# Patient Record
Sex: Female | Born: 1980 | Race: Black or African American | Hispanic: No | Marital: Single | Smoking: Never smoker
Health system: Southern US, Community
[De-identification: ages and names within clinical notes are randomized; demographics above are authoritative.]

## PROBLEM LIST (undated history)

## (undated) DIAGNOSIS — O24419 Gestational diabetes mellitus in pregnancy, unspecified control: Secondary | ICD-10-CM

## (undated) DIAGNOSIS — R519 Headache, unspecified: Secondary | ICD-10-CM

## (undated) DIAGNOSIS — I10 Essential (primary) hypertension: Secondary | ICD-10-CM

## (undated) HISTORY — DX: Gestational diabetes mellitus in pregnancy, unspecified control: O24.419

## (undated) HISTORY — DX: Essential (primary) hypertension: I10

## (undated) HISTORY — DX: Headache, unspecified: R51.9

## (undated) HISTORY — PX: LIPOSUCTION: SHX10

---

## 2013-02-24 ENCOUNTER — Emergency Department (HOSPITAL_BASED_OUTPATIENT_CLINIC_OR_DEPARTMENT_OTHER)
Admission: EM | Admit: 2013-02-24 | Discharge: 2013-02-24 | Disposition: A | Discharge status: Home / Self Care | Payer: Self-pay | Attending: Emergency Medicine | Admitting: Emergency Medicine

## 2013-02-24 ENCOUNTER — Emergency Department (HOSPITAL_BASED_OUTPATIENT_CLINIC_OR_DEPARTMENT_OTHER): Payer: Self-pay

## 2013-02-24 ENCOUNTER — Encounter (HOSPITAL_BASED_OUTPATIENT_CLINIC_OR_DEPARTMENT_OTHER): Payer: Self-pay | Admitting: Family Medicine

## 2013-02-24 DIAGNOSIS — H9201 Otalgia, right ear: Secondary | ICD-10-CM

## 2013-02-24 DIAGNOSIS — Z3202 Encounter for pregnancy test, result negative: Secondary | ICD-10-CM | POA: Insufficient documentation

## 2013-02-24 DIAGNOSIS — H9209 Otalgia, unspecified ear: Secondary | ICD-10-CM | POA: Insufficient documentation

## 2013-02-24 LAB — PREGNANCY, URINE: Preg Test, Ur: NEGATIVE

## 2013-02-24 MED ORDER — CEPHALEXIN 500 MG PO CAPS: 500.0000 mg | ORAL_CAPSULE | Freq: Four times a day (QID) | ORAL | Status: DC

## 2013-02-24 MED ORDER — OXYCODONE-ACETAMINOPHEN 5-325 MG PO TABS: 2.0000 | ORAL_TABLET | ORAL | Status: DC | PRN

## 2013-02-24 NOTE — ED Provider Notes (Signed)
History     CSN: [id number]  Arrival date & time 02/24/13  1047   First MD Initiated Contact with Patient 02/24/13 1132      Chief Complaint  Patient presents with  . Headache    (Consider location/radiation/quality/duration/timing/severity/associated sxs/prior treatment) HPI Comments: Patient complains of right-sided headache near her right ear for the past one day. 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 Pain is spreading diffusely from that site in her head. Denies any nausea, vomiting or fever. Denies any focal weakness, numbness or tingling. No vision changes. No bleeding or drainage from sites or from ear itself.  The history is provided by the patient.    History reviewed. No pertinent past medical history.  History reviewed. No pertinent past surgical history.  No family history on file.  History  Substance Use Topics  . Smoking status: Never Smoker   . Smokeless tobacco: Not on file  . Alcohol Use: Yes    OB History   Grav Para Term Preterm Abortions TAB SAB Ect Mult Living                  Review of Systems  Constitutional: Negative for fever, activity change and appetite change.  HENT: Positive for ear pain. Negative for congestion, rhinorrhea and ear discharge.   Respiratory: Negative for cough, chest tightness and shortness of breath.   Cardiovascular: Negative for chest pain.  Gastrointestinal: Negative for nausea, vomiting and abdominal pain.  Genitourinary: Negative for dysuria and hematuria.  Musculoskeletal: Negative for back pain.  Neurological: Positive for headaches.  A complete 10 system review of systems was obtained and all systems are negative except as noted in the HPI and PMH.    Allergies  Review of patient's allergies indicates no known allergies.  Home Medications   Current Outpatient Rx  Name  Route  Sig  Dispense  Refill  . cephALEXin (KEFLEX) 500 MG capsule   Oral   Take 1  capsule (500 mg total) by mouth 4 (four) times daily.   40 capsule   0   . oxyCODONE-acetaminophen (PERCOCET/ROXICET) 5-325 MG per tablet   Oral   Take 2 tablets by mouth every 4 (four) hours as needed for pain.   15 tablet   0     BP 121/78  Pulse 90  Temp(Src) 98.3 F (36.8 C) (Oral)  Resp 16  Ht 5\' 7"  (1.702 m)  Wt 180 lb (81.647 kg)  BMI 28.19 kg/m2  SpO2 100%  LMP 01/16/2013  Physical Exam  Constitutional: She is oriented to person, place, and time. She appears well-developed and well-nourished. No distress.  HENT:  Head: Normocephalic and atraumatic.  Left Ear: External ear normal.  Ears:  Mouth/Throat: Oropharynx is clear and moist. No oropharyngeal exudate.  Small punctate lesion. No surrounding erythema or fluctuance. No tragus pain. No mastoid pain.  Eyes: EOM are normal. Pupils are equal, round, and reactive to light.  Neck: Normal range of motion. Neck supple.  Cardiovascular: Normal rate, regular rhythm and normal heart sounds.   No murmur heard. Pulmonary/Chest: Effort normal and breath sounds normal. No respiratory distress.  Abdominal: Soft. There is no tenderness. There is no rebound and no guarding.  Musculoskeletal: Normal range of motion. She exhibits no edema and no tenderness.  Neurological: She is alert and oriented to person, place, and time. No cranial nerve deficit. She exhibits normal muscle tone. Coordination normal.  CN 2-12 intact, 5/5 strength throughout, no ataxia on finger to nose.    ED Course  Procedures (including critical care time)  Labs Reviewed  PREGNANCY, URINE   Ct Head Wo Contrast  02/24/2013   *RADIOLOGY REPORT*  Clinical Data: Headache  CT HEAD WITHOUT CONTRAST  Technique:  Contiguous axial images were obtained from the base of the skull through the vertex without contrast.  Comparison: None.  Findings: No skull fracture is noted.  Paranasal sinuses and mastoid air cells are unremarkable.  No intracranial hemorrhage, mass  effect or midline shift.  The gray and white matter differentiation is preserved.  No acute infarction.  No hydrocephalus.  No mass lesion is noted on this unenhanced scan. No intra or extra-axial fluid collection.  IMPRESSION: No acute intracranial abnormality.   Original Report Authenticated By: Natasha Mead, M.D.     1. Ear pain, right       MDM  Right ear pain for the past day. No evidence of otitis externa. No fever. Pain spreads up causing a diffuse headache.  There is no erythema, drainage or fluctuance to the site she is concerned with on her ear.  We'll treat pain, warm compresses, empiric Keflex, followup with ENT.       Glynn Octave, MD 02/24/13 1313

## 2013-02-24 NOTE — ED Notes (Signed)
Pt c/o head "aching" near right ear x 1 day. 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 Pt did not take any meds at home prior to coming to dept.

## 2014-04-16 ENCOUNTER — Encounter (HOSPITAL_BASED_OUTPATIENT_CLINIC_OR_DEPARTMENT_OTHER): Payer: Self-pay | Admitting: Emergency Medicine

## 2014-04-16 ENCOUNTER — Emergency Department (HOSPITAL_BASED_OUTPATIENT_CLINIC_OR_DEPARTMENT_OTHER): Payer: Self-pay

## 2014-04-16 ENCOUNTER — Emergency Department (HOSPITAL_BASED_OUTPATIENT_CLINIC_OR_DEPARTMENT_OTHER)
Admission: EM | Admit: 2014-04-16 | Discharge: 2014-04-17 | Disposition: A | Discharge status: Home / Self Care | Payer: Self-pay | Attending: Emergency Medicine | Admitting: Emergency Medicine

## 2014-04-16 DIAGNOSIS — Z792 Long term (current) use of antibiotics: Secondary | ICD-10-CM | POA: Insufficient documentation

## 2014-04-16 DIAGNOSIS — J209 Acute bronchitis, unspecified: Secondary | ICD-10-CM | POA: Insufficient documentation

## 2014-04-16 NOTE — ED Provider Notes (Signed)
CSN: [id number]     Arrival date & time 04/16/14  2324 History  This chart was scribed for Kristin SeamenJohn L Shannelle Alguire, MD by Julian HyMorgan Graham, ED Scribe. The patient was seen in [facility]. The patient's care was started at 11:46 PM.    Chief Complaint  Patient presents with  . Cough   The history is provided by the patient. No language interpreter was used.   HPI Comments: Kristin Lucas is a 33 y.o. female who presents to the Emergency Department complaining of persistent, occasionally productive cough for over a week. Pt states she has associated headache and chest pain with cough, intermittent SOB, diarrhea and wheezing. She has been taking OTC medications with minimal relief. Pt denies earache, vomiting, sore throat and abdominal pain.   History reviewed. No pertinent past medical history. Past Surgical History  Procedure Laterality Date  . Cesarean section     No family history on file. History  Substance Use Topics  . Smoking status: Never Smoker   . Smokeless tobacco: Not on file  . Alcohol Use: Yes     Comment: occ   OB History   Grav Para Term Preterm Abortions TAB SAB Ect Mult Living                 Review of Systems  Respiratory: Positive for cough, shortness of breath and wheezing.   All other systems reviewed and are negative.  A complete 10 system review of systems was obtained and all systems are negative except as noted in the HPI and PMH.    Allergies  Review of patient's allergies indicates no known allergies.  Home Medications   Prior to Admission medications   Medication Sig Start Date End Date Taking? Authorizing Provider  cephALEXin (KEFLEX) 500 MG capsule Take 1 capsule (500 mg total) by mouth 4 (four) times daily. 02/24/13   Glynn OctaveStephen Rancour, MD  oxyCODONE-acetaminophen (PERCOCET/ROXICET) 5-325 MG per tablet Take 2 tablets by mouth every 4 (four) hours as needed for pain. 02/24/13   Glynn OctaveStephen Rancour, MD   Triage Vitals: BP 152/95  Pulse 76  Temp(Src) 97.9 F (36.6  C) (Oral)  Resp 18  Ht 5\' 7"  (1.702 m)  Wt 196 lb (88.905 kg)  BMI 30.69 kg/m2  SpO2 98%  LMP 03/17/2014 Physical Exam  Nursing note and vitals reviewed. General: Well-developed, well-nourished female in no acute distress; appearance consistent with age of record HENT: normocephalic; atraumatic; patechiae of palate Eyes: pupils equal, round and reactive to light; extraocular muscles intact Neck: supple Heart: regular rate and rhythm; no murmurs, rubs or gallops Lungs: clear to auscultation bilaterally Abdomen: soft; nondistended; nontender; no masses or hepatosplenomegaly; bowel sounds present Extremities: No deformity; full range of motion; pulses normal Neurologic: Awake, alert and oriented; motor function intact in all extremities and symmetric; no facial droop Skin: Warm and dry Psychiatric: Normal mood and affect    ED Course  Procedures (including critical care time) DIAGNOSTIC STUDIES: Oxygen Saturation is 98% on RA, normal by my interpretation.    COORDINATION OF CARE: 11:50 PM- Patient informed of current plan for treatment and evaluation and agrees with plan at this time.   MDM  Nursing notes and vitals signs, including pulse oximetry, reviewed.  Summary of this visit's results, reviewed by myself:  Imaging Studies: Dg Chest 2 View  04/16/2014   CLINICAL DATA:  Cough for 6 days.  EXAM: CHEST  2 VIEW  COMPARISON:  None.  FINDINGS: Mild thoracic curvature convex towards the right may represent  patient positioning or mild scoliosis. Normal heart size and pulmonary vascularity. Lungs appear clear and expanded. No focal airspace disease or consolidation. No blunting of costophrenic angles. No pneumothorax.  IMPRESSION: No active cardiopulmonary disease.   Electronically Signed   By: Burman Nieves M.D.   On: 04/16/2014 23:58    I personally performed the services described in this documentation, which was scribed in my presence. The recorded information has been  reviewed and is accurate.   Kristin Seamen, MD 04/17/14 [phone]

## 2014-04-16 NOTE — ED Notes (Signed)
Productive Cough and chest congestion x1 week.

## 2014-04-16 NOTE — ED Notes (Signed)
Patient transported to X-ray 

## 2014-04-17 MED ORDER — ALBUTEROL SULFATE HFA 108 (90 BASE) MCG/ACT IN AERS
2.0000 | INHALATION_SPRAY | RESPIRATORY_TRACT | Status: DC | PRN
  Administered 2014-04-17: 2 via RESPIRATORY_TRACT
  Filled 2014-04-17: qty 6.7

## 2014-04-17 MED ORDER — HYDROCOD POLST-CHLORPHEN POLST 10-8 MG/5ML PO LQCR: 5.0000 mL | Freq: Two times a day (BID) | ORAL | Status: DC | PRN

## 2014-04-17 NOTE — Discharge Instructions (Signed)
Acute Bronchitis °Bronchitis is inflammation of the airways that extend from the windpipe into the lungs (bronchi). The inflammation often causes mucus to develop. This leads to a cough, which is the most common symptom of bronchitis.  °In acute bronchitis, the condition usually develops suddenly and goes away over time, usually in a couple weeks. Smoking, allergies, and asthma can make bronchitis worse. Repeated episodes of bronchitis may cause further lung problems.  °CAUSES °Acute bronchitis is most often caused by the same virus that causes a cold. The virus can spread from person to person (contagious).  °SIGNS AND SYMPTOMS  °· Cough.   °· Fever.   °· Coughing up mucus.   °· Body aches.   °· Chest congestion.   °· Chills.   °· Shortness of breath.   °· Sore throat.   °DIAGNOSIS  °Acute bronchitis is usually diagnosed through a physical exam. Tests, such as chest X-rays, are sometimes done to rule out other conditions.  °TREATMENT  °Acute bronchitis usually goes away in a couple weeks. Often times, no medical treatment is necessary. Medicines are sometimes given for relief of fever or cough. Antibiotics are usually not needed but may be prescribed in certain situations. In some cases, an inhaler may be recommended to help reduce shortness of breath and control the cough. A cool mist vaporizer may also be used to help thin bronchial secretions and make it easier to clear the chest.  °HOME CARE INSTRUCTIONS °· Get plenty of rest.   °· Drink enough fluids to keep your urine clear or pale yellow (unless you have a medical condition that requires fluid restriction). Increasing fluids may help thin your secretions and will prevent dehydration.   °· Only take over-the-counter or prescription medicines as directed by your health care provider.   °· Avoid smoking and secondhand smoke. Exposure to cigarette smoke or irritating chemicals will make bronchitis worse. If you are a smoker, consider using nicotine gum or skin  patches to help control withdrawal symptoms. Quitting smoking will help your lungs heal faster.   °· Reduce the chances of another bout of acute bronchitis by washing your hands frequently, avoiding people with cold symptoms, and trying not to touch your hands to your mouth, nose, or eyes.   °· Follow up with your health care provider as directed.   °SEEK MEDICAL CARE IF: °Your symptoms do not improve after 1 week of treatment.  °SEEK IMMEDIATE MEDICAL CARE IF: °· You develop an increased fever or chills.   °· You have chest pain.   °· You have severe shortness of breath. °· You have bloody sputum.   °· You develop dehydration. °· You develop fainting. °· You develop repeated vomiting. °· You develop a severe headache. °MAKE SURE YOU:  °· Understand these instructions. °· Will watch your condition. °· Will get help right away if you are not doing well or get worse. °Document Released: 11/01/2004 Document Revised: 05/27/2013 Document Reviewed: 03/17/2013 °[employer]® Patient Information ©2015 [employer]. This information is not intended to replace advice given to you by your health care provider. Make sure you discuss any questions you have with your health care provider. ° °

## 2015-01-25 ENCOUNTER — Encounter (HOSPITAL_COMMUNITY): Payer: Self-pay | Admitting: *Deleted

## 2015-01-25 ENCOUNTER — Emergency Department (INDEPENDENT_AMBULATORY_CARE_PROVIDER_SITE_OTHER)
Admission: EM | Admit: 2015-01-25 | Discharge: 2015-01-25 | Disposition: A | Discharge status: Home / Self Care | Payer: PRIVATE HEALTH INSURANCE | Source: Home / Self Care | Attending: Emergency Medicine | Admitting: Emergency Medicine

## 2015-01-25 DIAGNOSIS — J40 Bronchitis, not specified as acute or chronic: Secondary | ICD-10-CM | POA: Diagnosis not present

## 2015-01-25 DIAGNOSIS — K5909 Other constipation: Secondary | ICD-10-CM

## 2015-01-25 DIAGNOSIS — K5904 Chronic idiopathic constipation: Secondary | ICD-10-CM

## 2015-01-25 MED ORDER — AZITHROMYCIN 250 MG PO TABS
ORAL_TABLET | ORAL | Status: DC
Start: 2015-01-25 — End: 2015-07-19

## 2015-01-25 MED ORDER — PREDNISONE 50 MG PO TABS: ORAL_TABLET | ORAL | Status: DC

## 2015-01-25 MED ORDER — ALBUTEROL SULFATE HFA 108 (90 BASE) MCG/ACT IN AERS: 2.0000 | INHALATION_SPRAY | Freq: Four times a day (QID) | RESPIRATORY_TRACT | Status: AC | PRN

## 2015-01-25 MED ORDER — HYDROCODONE-HOMATROPINE 5-1.5 MG/5ML PO SYRP: 5.0000 mL | ORAL_SOLUTION | Freq: Four times a day (QID) | ORAL | Status: DC | PRN

## 2015-01-25 MED ORDER — LACTULOSE 10 GM/15ML PO SOLN: 10.0000 g | Freq: Two times a day (BID) | ORAL | Status: DC

## 2015-01-25 NOTE — Discharge Instructions (Signed)
You have bronchitis. Take prednisone and azithromycin as prescribed. Use the albuterol inhaler as needed for wheezing. You can makes honey with warm water for your cough. Use the Hycodan cough syrup as needed.  Take lactulose twice a day until you are having regular bowel movements. Then, you can take it daily or as needed.  Follow-up as needed.

## 2015-01-25 NOTE — ED Provider Notes (Signed)
CSN: [id number]     Arrival date & time 01/25/15  1439 History   First MD Initiated Contact with Patient 01/25/15 1611     Chief Complaint  Patient presents with  . Cough   (Consider location/radiation/quality/duration/timing/severity/associated sxs/prior Treatment) HPI  She is a 34 year old woman here for evaluation of cough. She states she has had a cough with headaches and subjective fever for the last 2 weeks. This is associated with a sore throat. In the last few days, the cough has been getting worse. She also reports some pain in her chest with coughing. She has had some wheezing as well as feeling short of breath. No fevers in the last few days. No nausea or vomiting. Her appetite is good. She's been taking DayQuil and NyQuil with minimal improvement.  She also reports a long history of chronic constipation. She states she has a bowel movement about 3 times a month. She has tried MiraLAX without improvement.  History reviewed. No pertinent past medical history. Past Surgical History  Procedure Laterality Date  . Cesarean section  2007   History reviewed. No pertinent family history. History  Substance Use Topics  . Smoking status: Never Smoker   . Smokeless tobacco: Not on file  . Alcohol Use: Yes     Comment: occ   OB History    No data available     Review of Systems  Constitutional: Positive for fever. Negative for activity change and appetite change.  HENT: Positive for sore throat. Negative for congestion, rhinorrhea and trouble swallowing.   Respiratory: Positive for cough, shortness of breath and wheezing.   Cardiovascular: Positive for chest pain (with cough).  Gastrointestinal: Negative for nausea and vomiting.  Neurological: Positive for headaches.    Allergies  Review of patient's allergies indicates no known allergies.  Home Medications   Prior to Admission medications   Medication Sig Start Date End Date Taking? Authorizing Provider  albuterol  (PROVENTIL HFA;VENTOLIN HFA) 108 (90 BASE) MCG/ACT inhaler Inhale 2 puffs into the lungs every 6 (six) hours as needed for wheezing or shortness of breath. 01/25/15   Charm RingsErin J Rhyder Koegel, MD  azithromycin (ZITHROMAX Z-PAK) 250 MG tablet Take 2 pills today, then 1 pill daily until gone. 01/25/15   Charm RingsErin J Darcee Dekker, MD  HYDROcodone-homatropine (HYCODAN) 5-1.5 MG/5ML syrup Take 5 mLs by mouth every 6 (six) hours as needed for cough. 01/25/15   Charm RingsErin J Avarie Tavano, MD  lactulose (CHRONULAC) 10 GM/15ML solution Take 15 mLs (10 g total) by mouth 2 (two) times daily. 01/25/15   Charm RingsErin J Lorissa Kishbaugh, MD  predniSONE (DELTASONE) 50 MG tablet Take 1 pill daily for 5 days. 01/25/15   Charm RingsErin J Kayley Zeiders, MD   BP 140/86 mmHg  Pulse 95  Temp(Src) 98.2 F (36.8 C) (Oral)  Resp 12  SpO2 100%  LMP 01/20/2015 Physical Exam  Constitutional: She is oriented to person, place, and time. She appears well-developed and well-nourished.  HENT:  Nose: Nose normal.  Mouth/Throat: No oropharyngeal exudate.  Neck: Neck supple.  Cardiovascular: Normal rate, regular rhythm and normal heart sounds.   No murmur heard. Pulmonary/Chest: Effort normal and breath sounds normal. No respiratory distress. She has no wheezes. She has no rales. She exhibits tenderness.  Lymphadenopathy:    She has no cervical adenopathy.  Neurological: She is alert and oriented to person, place, and time.    ED Course  Procedures (including critical care time) Labs Review Labs Reviewed - No data to display  Imaging Review No results  found.   MDM   1. Bronchitis   2. Constipation - functional    We'll treat bronchitis with prednisone and azithromycin. Albuterol and Hycodan to use as needed. Lactulose given for chronic constipation. Follow-up as needed.    Charm Rings, MD 01/25/15 [phone]

## 2015-01-25 NOTE — ED Notes (Addendum)
C/o cough, hard to breathe, sore throat and headache off and on for 2 weeks. Has had the cold for almost 2 weeks.  No runny nose or earache.  Has had some wheezing.  C/o ant/post. chest pain from coughing.

## 2015-07-19 ENCOUNTER — Encounter: Payer: Self-pay | Admitting: Certified Nurse Midwife

## 2015-07-19 ENCOUNTER — Ambulatory Visit (INDEPENDENT_AMBULATORY_CARE_PROVIDER_SITE_OTHER): Payer: PRIVATE HEALTH INSURANCE | Admitting: Certified Nurse Midwife

## 2015-07-19 VITALS — BP 120/74 | HR 68 | Resp 16 | Ht 66.5 in | Wt 189.0 lb

## 2015-07-19 DIAGNOSIS — Z0000 Encounter for general adult medical examination without abnormal findings: Secondary | ICD-10-CM | POA: Diagnosis not present

## 2015-07-19 DIAGNOSIS — Z124 Encounter for screening for malignant neoplasm of cervix: Secondary | ICD-10-CM

## 2015-07-19 DIAGNOSIS — Z01419 Encounter for gynecological examination (general) (routine) without abnormal findings: Secondary | ICD-10-CM

## 2015-07-19 NOTE — Addendum Note (Signed)
Addended by: Eliezer Bottom on: 07/19/2015 11:48 AM   Modules accepted: Orders, SmartSet

## 2015-07-19 NOTE — Patient Instructions (Signed)

## 2015-07-19 NOTE — Progress Notes (Signed)
Encounter reviewed Jill Jertson, MD   

## 2015-07-19 NOTE — Progress Notes (Signed)
34 y.o. G1P1001 Single  African American Fe here to establish gyn care and  for annual exam. Periods monthly,normal, heavy at times with 5-6 duration. Uses Motrin for cramping, works well. Sexually active and has noted, which is light but requires a panti-liner for 3 days. Notices this happens only with latex condoms and not with lamb skin use. Desires STD screening. Sees Urgent care if needed. No other health issues today.  Patient's last menstrual period was 06/23/2015.          Sexually active: Yes.    The current method of family planning is condoms all the time.    Exercising: No.  exercise Smoker:  no  Health Maintenance: Pap:  2013 neg per patient MMG:  2013 neg per patient Colonoscopy:  none BMD:   none TDaP: unsure, pt declines today Labs: none Self breast exam: not done   reports that she has never smoked. She does not have any smokeless tobacco history on file. She reports that she drinks alcohol. She reports that she does not use illicit drugs.  History reviewed. No pertinent past medical history.  Past Surgical History  Procedure Laterality Date  . Cesarean section  2007    Current Outpatient Prescriptions  Medication Sig Dispense Refill  . albuterol (PROVENTIL HFA;VENTOLIN HFA) 108 (90 BASE) MCG/ACT inhaler Inhale 2 puffs into the lungs every 6 (six) hours as needed for wheezing or shortness of breath. 1 Inhaler 0   No current facility-administered medications for this visit.    Family History  Problem Relation Age of Onset  . Diabetes Father   . Hypertension Father     ROS:  Pertinent items are noted in HPI.  Otherwise, a comprehensive ROS was negative.  Exam:   BP 120/74 mmHg  Pulse 68  Resp 16  Ht 5' 6.5" (1.689 m)  Wt 189 lb (85.73 kg)  BMI 30.05 kg/m2  LMP 06/23/2015 Height: 5' 6.5" (168.9 cm) Ht Readings from Last 3 Encounters:  07/19/15 5' 6.5" (1.689 m)  04/16/14  (1.702 m)  02/24/13  (1.702 m)    General appearance: alert,  cooperative and appears stated age Head: Normocephalic, without obvious abnormality, atraumatic Neck: no adenopathy, supple, symmetrical, trachea midline and thyroid normal to inspection and palpation Lungs: clear to auscultation bilaterally Breasts: normal appearance, no masses or tenderness, No nipple retraction or dimpling, No nipple discharge or bleeding, No axillary or supraclavicular adenopathy Heart: regular rate and rhythm Abdomen: soft, non-tender; no masses,  no organomegaly Extremities: extremities normal, atraumatic, no cyanosis or edema Skin: Skin color, texture, turgor normal. No rashes or lesions Lymph nodes: Cervical, supraclavicular, and axillary nodes normal. No abnormal inguinal nodes palpated Neurologic: Grossly normal   Pelvic: External genitalia:  no lesions              Urethra:  normal appearing urethra with no masses, tenderness or lesions              Bartholin's and Skene's: normal                 Vagina: normal appearing vagina with normal color and discharge, no lesions, affirm taken, slight odor noted              Cervix: normal appearance, non tender, no lesions, no bleeding with pap smear              Pap taken: Yes.   Bimanual Exam:  Uterus:  normal size, contour, position, consistency, mobility, non-tender  Adnexa: normal adnexa and no mass, fullness, tenderness               Rectovaginal: Confirms               Anus:  normal sphincter tone, no lesions  Chaperone present: yes  A:  Well Woman with normal exam  Contraception condoms consistent  Post coital spotting with latex condom use  STD screening  Immunization due  P:   Reviewed health and wellness pertinent to exam  Discussed non latex condom use consistently to see if spotting stops. Patient to advise if continues, may need further evaluation.  Lab: HIV, RPR, Gc,Chlamydia, Affirm  Pt aware TDAP due. Pt declines  Pap smear as above with HPVHR   counseled on breast self exam, STD  prevention, HIV risk factors and prevention, adequate intake of calcium and vitamin D, diet and exercise  return annually or prn  An After Visit Summary was printed and given to the patient.

## 2015-07-20 ENCOUNTER — Other Ambulatory Visit: Payer: Self-pay | Admitting: Certified Nurse Midwife

## 2015-07-20 DIAGNOSIS — B373 Candidiasis of vulva and vagina: Secondary | ICD-10-CM

## 2015-07-20 DIAGNOSIS — B3731 Acute candidiasis of vulva and vagina: Secondary | ICD-10-CM

## 2015-07-20 DIAGNOSIS — B9689 Other specified bacterial agents as the cause of diseases classified elsewhere: Secondary | ICD-10-CM

## 2015-07-20 DIAGNOSIS — Z0000 Encounter for general adult medical examination without abnormal findings: Secondary | ICD-10-CM

## 2015-07-20 DIAGNOSIS — N760 Acute vaginitis: Principal | ICD-10-CM

## 2015-07-20 LAB — WET PREP BY MOLECULAR PROBE
Candida species: POSITIVE — AB
GARDNERELLA VAGINALIS: POSITIVE — AB
TRICHOMONAS VAG: NEGATIVE

## 2015-07-20 MED ORDER — HYLAFEM VA SUPP: 1.0000 | Freq: Every day | VAGINAL | Status: DC

## 2015-07-21 ENCOUNTER — Ambulatory Visit: Payer: PRIVATE HEALTH INSURANCE | Admitting: Certified Nurse Midwife

## 2015-07-21 LAB — IPS PAP TEST WITH HPV

## 2015-07-21 LAB — IPS N GONORRHOEA AND CHLAMYDIA BY PCR

## 2015-07-26 ENCOUNTER — Encounter: Payer: Self-pay | Admitting: Certified Nurse Midwife

## 2015-07-27 ENCOUNTER — Telehealth: Payer: Self-pay | Admitting: Emergency Medicine

## 2015-07-27 NOTE — Telephone Encounter (Signed)
Telephone call for triage created to discuss message with patient and disposition as appropriate.   

## 2015-07-27 NOTE — Telephone Encounter (Signed)
Chief Complaint  Patient presents with  . Advice Only    Patient sent mychart message     ===View-only below this line===   ----- Message -----    From: Lenard LancePOE,Madelline    Sent: 07/26/2015  1:28 PM EDT      To: Leota SauersLEONARD,DEBORAH, CNM Subject: Non-Urgent Medical Question  Hello my question is  With the suppositories that I have been taking does it supposed to make my stomach cramp with like sharp pains here and there ? I'm not sure if it's related to the meds, but I've notice it seen I've been taking them please help !!!  Also I've misplaced one of the capsules

## 2015-07-27 NOTE — Telephone Encounter (Signed)
Spoke with a patient. Advised of message as seen below from PepsiCoDeborah Leonard CNM. Patient is agreeable and verbalizes understanding.  Routing to provider for final review. Patient agreeable to disposition. Will close encounter.

## 2015-07-27 NOTE — Telephone Encounter (Signed)
Call to patient to discuss mychart message.  She states she will call office back after 9 am.

## 2015-07-27 NOTE — Telephone Encounter (Signed)
Spoke with patient. Patient states that she has used two suppositories of the Hylafem rx. Sent starting the medication she has been experiencing lower abdominal cramping with intermittent sharp pains. "I was not having any issues prior to using the medication." Denies any nausea, vomiting, fever, or changes in bowel movements. Patient states that she misplaced her last suppository. "I am not sure if I need a refill or if these symptoms are normal and I should continue using the medication." Advised patient to discontinue use of the medication at this time. Will speak with Leota Sauerseborah Leonard CNM and return call with further recommendations. Patient is agreeable.

## 2015-07-27 NOTE — Telephone Encounter (Signed)
I do not feel the cramping was from the medication. She may have cleared with infection with 2 suppositories. Patient should one week from last one inserted to see if resolved, if no symptoms, no treatment.  If symptoms are still present retreat with additional suppositories. She has refill.  Advise if any problems with.

## 2015-08-23 ENCOUNTER — Telehealth: Payer: Self-pay | Admitting: Certified Nurse Midwife

## 2015-08-23 NOTE — Telephone Encounter (Signed)
Patient has some questions regarding the medication given for her bacterial infection. Patient is still having symptoms and is asking if she needs a different prescription. Last seen 07/19/15. Confirmed pharmacy with patient.

## 2015-08-23 NOTE — Telephone Encounter (Signed)
Call to patient. She states that she used Hylafem 10/19 for the 3 day treatment and feels that symptoms of bacterial vaginosis have not every really improved. She c/o ongoing vaginal odor and clear discharge. No vaginal itching, no fevers or pelvic pain.  Has changed to non-latex condoms per instructions from Leota Sauerseborah Leonard CNM. Patient states she called her pharmacy and there was not a refill available on Hylafem, she states she used just 3 tablets only and it is not covered by insurance. Would like alternative. Offered MD appointment to discuss as patient has concerns about frequency of bacterial vaginosis. Patient thinks she is going to start her cycle tomorrow and declines office visit until after cycle is completed. She is scheduled for office visit with Dr. Hyacinth MeekerMiller on 08/31/15 at 0830 and is advised to call back with any concerns prior to appointment.  Routing to provider for final review. Patient agreeable to disposition. Will close encounter.

## 2015-08-31 ENCOUNTER — Other Ambulatory Visit: Payer: Self-pay | Admitting: Certified Nurse Midwife

## 2015-08-31 ENCOUNTER — Encounter: Payer: Self-pay | Admitting: Obstetrics & Gynecology

## 2015-08-31 ENCOUNTER — Ambulatory Visit (INDEPENDENT_AMBULATORY_CARE_PROVIDER_SITE_OTHER): Payer: PRIVATE HEALTH INSURANCE | Admitting: Obstetrics & Gynecology

## 2015-08-31 VITALS — BP 122/80 | HR 84 | Ht 66.5 in | Wt 186.4 lb

## 2015-08-31 DIAGNOSIS — Z0000 Encounter for general adult medical examination without abnormal findings: Secondary | ICD-10-CM

## 2015-08-31 DIAGNOSIS — N898 Other specified noninflammatory disorders of vagina: Secondary | ICD-10-CM

## 2015-08-31 DIAGNOSIS — A499 Bacterial infection, unspecified: Secondary | ICD-10-CM | POA: Diagnosis not present

## 2015-08-31 DIAGNOSIS — N912 Amenorrhea, unspecified: Secondary | ICD-10-CM

## 2015-08-31 DIAGNOSIS — N760 Acute vaginitis: Secondary | ICD-10-CM | POA: Diagnosis not present

## 2015-08-31 DIAGNOSIS — B9689 Other specified bacterial agents as the cause of diseases classified elsewhere: Secondary | ICD-10-CM

## 2015-08-31 LAB — TSH: TSH: 1.295 u[IU]/mL (ref 0.350–4.500)

## 2015-08-31 LAB — POCT URINE PREGNANCY: Preg Test, Ur: NEGATIVE

## 2015-08-31 MED ORDER — METRONIDAZOLE 500 MG PO TABS: 500.0000 mg | ORAL_TABLET | Freq: Two times a day (BID) | ORAL | Status: DC

## 2015-08-31 MED ORDER — FLUCONAZOLE 150 MG PO TABS: ORAL_TABLET | ORAL | Status: DC

## 2015-08-31 NOTE — Progress Notes (Signed)
Subjective:     Patient ID: Lenard LanceAntiniya Parton, female   DOB: 1981/08/26, 34 y.o.   MRN: [id number]  HPI 34 yo g1P1 Single AAF here for complaint of continued vaginal discharge and fishy odor that has been intermittent for the last several months.  PT was seen as new pt with Lovett Soxebbi Leonard, CNM, on 07/19/15.  She was diagnosed with BV (which she reports she's had this in the past).  She was prescribed Hylafem which the pt used.  She states it was expensive and the pharmacy had to order it.  Symptoms went away for three days and then came right back. D/w pt treatment options if still present today as well as possible regimens for recurrent BV.  Pt doesn't really feel like it was fully treated as her symptoms were not cone for any length of time.   Pt also reports she hasn't had a cycle in two months.  UPT negative today.  She's having increased personal stressors.  Wants to know if anything needs to be done at this time.  Lab work and Provera challenge discussed if testing is normal.  Pt doesn't want to take the Provera if possible.  Advised ok to watch for 90 days but after that time, it is appropriate to take the Provera.     Pt had STD testing at last visit with Debbi but didn't do blood work.  Advised pt this can be done today as she is having blood work for the skipped cycles.  Pt in agreement.     Denies bowel or bladder changes  Review of Systems  Constitutional: Negative.   Gastrointestinal: Negative.   Genitourinary: Positive for vaginal discharge. Negative for dysuria, frequency, vaginal bleeding and vaginal pain.       Objective:   Physical Exam  Constitutional: She is oriented to person, place, and time. She appears well-developed and well-nourished.  Abdominal: Soft. Bowel sounds are normal. She exhibits no distension and no mass. There is no tenderness. There is no rebound and no guarding.  Genitourinary: Uterus normal. There is no rash, tenderness or lesion on the right labia. There is  no rash, tenderness or lesion on the left labia. Cervix exhibits no motion tenderness. Right adnexum displays no mass, no tenderness and no fullness. Left adnexum displays no mass, no tenderness and no fullness. No bleeding in the vagina. No foreign body around the vagina. Vaginal discharge found.  Lymphadenopathy:       Right: No inguinal adenopathy present.       Left: No inguinal adenopathy present.  Neurological: She is alert and oriented to person, place, and time.  Skin: Skin is warm and dry.  Psychiatric: She has a normal mood and affect.   Wet smear:  Ph 5.0, KOH: + whiff, neg yeast; Saline: neg trich, +clue cells    Assessment:     Bacterial vaginosis, possibly recurrent  H/O yeast on recent affirm Amenorrhea STD exposure without full evaluation     Plan:     Flagyl 500mg  bid x 7 days and diflucan 150mg  po x 1, repeat 72 hours if she starts to have itching with flagyl.  Pt knows to call with any recurrent symptoms.  Also, pt needs to  RPR and HIV FSH, TSH, and prolactin Declines provera today.  Will call in 1 month if no cycle.  Would then have pt do a home UPT and start provera challenge.

## 2015-09-01 LAB — HIV ANTIBODY (ROUTINE TESTING W REFLEX): HIV: NONREACTIVE

## 2015-09-01 LAB — RPR

## 2015-09-01 LAB — PROLACTIN: Prolactin: 11.9 ng/mL

## 2015-09-01 LAB — FOLLICLE STIMULATING HORMONE: FSH: 8.1 m[IU]/mL

## 2015-09-14 DIAGNOSIS — B9689 Other specified bacterial agents as the cause of diseases classified elsewhere: Secondary | ICD-10-CM | POA: Insufficient documentation

## 2015-09-14 DIAGNOSIS — N760 Acute vaginitis: Secondary | ICD-10-CM

## 2015-09-16 ENCOUNTER — Telehealth: Payer: Self-pay | Admitting: Emergency Medicine

## 2015-09-16 MED ORDER — MEDROXYPROGESTERONE ACETATE 10 MG PO TABS: 10.0000 mg | ORAL_TABLET | Freq: Every day | ORAL | Status: DC

## 2015-09-16 NOTE — Telephone Encounter (Signed)
Spoke with patient and message from Dr. Hyacinth MeekerMiller discussed. Discussed reason for provera use and patient questions answered. She is worried about a heavy flow after completing Provera. Advised patient to call back when she starts her cycle and/or to call us back if she does not have a cycle within two weeks after completing provera. Advised patient to call office if she has any heavy bleeding after completing Provera. Patient agreeable, verbalized understanding and follow up scheduled with Dr. Hyacinth MeekerMiller for 10/24/15.  Routing to provider for final review. Patient agreeable to disposition. Will close encounter.

## 2015-09-16 NOTE — Telephone Encounter (Signed)
This is not a cycle.  She should take a UPT and take Provera 10mg  for 10 days.  Then she will have a cycle and should have following in mid January.

## 2015-09-16 NOTE — Telephone Encounter (Signed)
-----   Message from Jerene BearsMary S Miller, MD sent at 09/14/2015 11:10 PM EST ----- Please inform pt her FSH, Prolactin, and TSH were normal.  Also, her HIV and RPR were negative.  This didn't result with this lab and I don't know why but it is in EPIC and negative.  I've been waiting on those results to have her called with results.  Pt had skipped a cycle for about two months.  She is aware to call at 90 days to start Provera challenged.  Declined provera when at the office.

## 2015-09-16 NOTE — Telephone Encounter (Signed)
Call to patient with results from Dr. Hyacinth MeekerMiller.  She states she completed flagyl and about one week later (last week) developed dark red spotting for 3-4 days. She states she used a pad on the first day of bleeding but bleeding was not heavy enough for remainder of days to use a pad. Patient asking if this is a cycle and if Dr. Hyacinth MeekerMiller would still need her to let us know if no cycle by mid-December. Advised patient will review with Dr. Hyacinth MeekerMiller and return her call.  Patient agreeable.

## 2015-10-07 ENCOUNTER — Telehealth: Payer: Self-pay | Admitting: Certified Nurse Midwife

## 2015-10-07 MED ORDER — FLUCONAZOLE 150 MG PO TABS: ORAL_TABLET | ORAL | Status: DC

## 2015-10-07 MED ORDER — METRONIDAZOLE 500 MG PO TABS: 500.0000 mg | ORAL_TABLET | Freq: Two times a day (BID) | ORAL | Status: DC
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
## 2015-10-07 NOTE — Telephone Encounter (Signed)
Patient notified and agreeable. Will follow up as needed.  Routing to provider for final review. Patient agreeable to disposition. Will close encounter.
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
## 2015-10-07 NOTE — Telephone Encounter (Signed)
rx completed and sent to [facility] on file.  Encounter closed.

## 2015-10-14 ENCOUNTER — Telehealth: Payer: Self-pay | Admitting: Obstetrics & Gynecology

## 2015-10-14 NOTE — Telephone Encounter (Signed)
Patient says she is concerned with dark colored spotting. She is taking estrogen pills. No chart

## 2015-10-14 NOTE — Telephone Encounter (Signed)
Please have pt try and finish the provera and follow up as scheduled.  Spotting is not worrisome to me but we will decide if need to proceed with anything else when she comes for follow up.

## 2015-10-14 NOTE — Telephone Encounter (Signed)
Spoke with patient. Advised of message as seen below from Dr.Miller. Patient is agreeable and verbalizes understanding.  Routing to provider for final review. Patient agreeable to disposition. Will close encounter   

## 2015-10-14 NOTE — Telephone Encounter (Signed)
Spoke with patient. Patient states that she has not had a menstrual cycle since October. Patient took Flagyl in November 2016 for BV. Called in on 09/16/2015 and was having spotting after taking the Flagyl. Was advised to take a UPT and start Provera 10 mg x 10 days. States she has been taking the Provera but not as directed. "i have a hard time remembering to take pills. I will take it one day then forget one day. Then I will take it another day and forget for two days." Patient is unable to tell me how many pills she has taken as her rx is at home. Advised of importance of taking the pills as directed. Patient states she is now having dark brown spotting that started 2-3 days ago. Denies any pelvic pain or cramping. Is scheduled for follow up on 10/24/2015 with Dr.Miller. Patient is concerned about dark spotting. Advised this is not uncommon since she has not had a cycle since October. This is old blood coming from not having a regular cycle. Advised patient I will speak with Dr.Miller and return call with further recommendations. Patient is agreeable.

## 2015-10-24 ENCOUNTER — Ambulatory Visit: Payer: PRIVATE HEALTH INSURANCE | Admitting: Obstetrics & Gynecology

## 2015-10-31 ENCOUNTER — Encounter: Payer: Self-pay | Admitting: Obstetrics and Gynecology

## 2015-10-31 ENCOUNTER — Ambulatory Visit (INDEPENDENT_AMBULATORY_CARE_PROVIDER_SITE_OTHER): Payer: PRIVATE HEALTH INSURANCE | Admitting: Obstetrics & Gynecology

## 2015-10-31 VITALS — BP 118/80 | HR 88 | Resp 16 | Ht 66.5 in | Wt 188.0 lb

## 2015-10-31 DIAGNOSIS — N915 Oligomenorrhea, unspecified: Secondary | ICD-10-CM | POA: Diagnosis not present

## 2015-11-06 ENCOUNTER — Encounter: Payer: Self-pay | Admitting: Obstetrics & Gynecology

## 2015-11-06 NOTE — Progress Notes (Signed)
Subjective:     Patient ID: Kristin Lucas, female   DOB: Dec 23, 1980, 35 y.o.   MRN: [id number]  HPI 35 yo G1P1 Single AA female here for follow up after not having a cycle for over 90 days.  Pt was seen in November, and at that time, had not cycles cine 07/19/15.  She and partner are contemplating pregnancy and she does not want to be on anything for contraception.  However, she is not actively trying at this time.  "if it happened, it would be fine" is her response to desires for pregnancy.  Pt had lab work for amenorrhea in November.  This was all normal.  Pt was advised to take Provera  x 10 days after she went 90 days without a cycle.  She did this in early January and has now had a full 6 days cycle.  LMP started 10/18/15.  Reports it was heavy for a couple of days but manageable.  She had some cramping that OTC products helped.    D/W pt importance of regular cycling and risks of endometrial hyperplasia and endometrial cancer in women that do not cycle regularly.  Also discussed with pt ovulation induction agents.  Pt is not ready for these type of medications, as this time.  She also does not want to be on any type of contraception.  Pt would prefer to continue using cyclic provera.  Aware she needs to monitor cycles and if she gets close to 90 days, she needs to take a home UPT and then call.  She will then be prescribed Provera.  D/W pt that spotting is not considered a cycle and that she needs to have bleeding that is consistent with a typical cycle for her to start recounting.  Pt coices understanding.     Review of Systems  All other systems reviewed and are negative.      Objective:   Physical Exam  Constitutional: She is oriented to person, place, and time. She appears well-developed and well-nourished.  Neurological: She is alert and oriented to person, place, and time.  Psychiatric: She has a normal mood and affect.       Assessment:     Oligomenorrhea     Plan:     Pt  will plan to use cyclic Provera every 90 days.  Knows to take home UPT and then call for prescription to be called into pharmacy.  Aware she can restart counting if she has a normal cycle.    Pt advised to start PNV and when ready for pregnancy, will discuss ovulation induction agents and any other appropriate fertility testing.  Pt voices understanding.   ~15 minutes spent with patient >50% of time was in face to face discussion of above.

## 2015-12-09 ENCOUNTER — Emergency Department (HOSPITAL_COMMUNITY): Payer: No Typology Code available for payment source

## 2015-12-09 ENCOUNTER — Emergency Department (HOSPITAL_COMMUNITY)
Admission: EM | Admit: 2015-12-09 | Discharge: 2015-12-10 | Disposition: A | Discharge status: Home / Self Care | Payer: No Typology Code available for payment source | Attending: Emergency Medicine | Admitting: Emergency Medicine

## 2015-12-09 ENCOUNTER — Encounter (HOSPITAL_COMMUNITY): Payer: Self-pay | Admitting: *Deleted

## 2015-12-09 DIAGNOSIS — S3992XA Unspecified injury of lower back, initial encounter: Secondary | ICD-10-CM | POA: Diagnosis not present

## 2015-12-09 DIAGNOSIS — Y92410 Unspecified street and highway as the place of occurrence of the external cause: Secondary | ICD-10-CM | POA: Diagnosis not present

## 2015-12-09 DIAGNOSIS — R519 Headache, unspecified: Secondary | ICD-10-CM

## 2015-12-09 DIAGNOSIS — S161XXA Strain of muscle, fascia and tendon at neck level, initial encounter: Secondary | ICD-10-CM | POA: Insufficient documentation

## 2015-12-09 DIAGNOSIS — S0990XA Unspecified injury of head, initial encounter: Secondary | ICD-10-CM | POA: Diagnosis not present

## 2015-12-09 DIAGNOSIS — Y998 Other external cause status: Secondary | ICD-10-CM | POA: Insufficient documentation

## 2015-12-09 DIAGNOSIS — S199XXA Unspecified injury of neck, initial encounter: Secondary | ICD-10-CM | POA: Diagnosis present

## 2015-12-09 DIAGNOSIS — Y9389 Activity, other specified: Secondary | ICD-10-CM | POA: Insufficient documentation

## 2015-12-09 DIAGNOSIS — Z79899 Other long term (current) drug therapy: Secondary | ICD-10-CM | POA: Diagnosis not present

## 2015-12-09 DIAGNOSIS — R51 Headache: Secondary | ICD-10-CM

## 2015-12-09 MED ORDER — HYDROCODONE-ACETAMINOPHEN 5-325 MG PO TABS
1.0000 | ORAL_TABLET | Freq: Once | ORAL | Status: AC
  Administered 2015-12-09: 1 via ORAL
  Filled 2015-12-09: qty 1

## 2015-12-09 MED ORDER — HYDROCODONE-ACETAMINOPHEN 5-325 MG PO TABS: ORAL_TABLET | ORAL | Status: DC
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
## 2015-12-10 MED ORDER — KETOROLAC TROMETHAMINE 60 MG/2ML IM SOLN
60.0000 mg | Freq: Once | INTRAMUSCULAR | Status: AC
  Administered 2015-12-10: 60 mg via INTRAMUSCULAR
  Filled 2015-12-10: qty 2

## 2015-12-10 MED ORDER — CYCLOBENZAPRINE HCL 10 MG PO TABS
10.0000 mg | ORAL_TABLET | Freq: Once | ORAL | Status: AC
Start: 2015-12-10 — End: 2015-12-10
  Administered 2015-12-10: 10 mg via ORAL
  Filled 2015-12-10: qty 1

## 2015-12-10 MED ORDER — HYDROCODONE-ACETAMINOPHEN 5-325 MG PO TABS: 1.0000 | ORAL_TABLET | ORAL | Status: DC | PRN

## 2015-12-10 MED ORDER — IBUPROFEN 800 MG PO TABS: 800.0000 mg | ORAL_TABLET | Freq: Three times a day (TID) | ORAL | Status: DC

## 2015-12-10 MED ORDER — CYCLOBENZAPRINE HCL 10 MG PO TABS: 10.0000 mg | ORAL_TABLET | Freq: Two times a day (BID) | ORAL | Status: DC | PRN
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
## 2016-02-22 ENCOUNTER — Encounter: Payer: Self-pay | Admitting: Obstetrics & Gynecology

## 2016-03-29 ENCOUNTER — Encounter: Payer: Self-pay | Admitting: Obstetrics & Gynecology

## 2016-07-05 ENCOUNTER — Emergency Department (HOSPITAL_BASED_OUTPATIENT_CLINIC_OR_DEPARTMENT_OTHER)
Admission: EM | Admit: 2016-07-05 | Discharge: 2016-07-05 | Disposition: A | Discharge status: Home / Self Care | Payer: Medicaid Other | Attending: Emergency Medicine | Admitting: Emergency Medicine

## 2016-07-05 ENCOUNTER — Encounter (HOSPITAL_BASED_OUTPATIENT_CLINIC_OR_DEPARTMENT_OTHER): Payer: Self-pay | Admitting: Emergency Medicine

## 2016-07-05 ENCOUNTER — Emergency Department (HOSPITAL_BASED_OUTPATIENT_CLINIC_OR_DEPARTMENT_OTHER): Payer: Medicaid Other

## 2016-07-05 DIAGNOSIS — B9689 Other specified bacterial agents as the cause of diseases classified elsewhere: Secondary | ICD-10-CM

## 2016-07-05 DIAGNOSIS — R109 Unspecified abdominal pain: Secondary | ICD-10-CM | POA: Diagnosis present

## 2016-07-05 DIAGNOSIS — N760 Acute vaginitis: Secondary | ICD-10-CM | POA: Diagnosis not present

## 2016-07-05 DIAGNOSIS — R102 Pelvic and perineal pain: Secondary | ICD-10-CM

## 2016-07-05 LAB — URINALYSIS, ROUTINE W REFLEX MICROSCOPIC
Bilirubin Urine: NEGATIVE
GLUCOSE, UA: NEGATIVE mg/dL
HGB URINE DIPSTICK: NEGATIVE
Ketones, ur: NEGATIVE mg/dL
Nitrite: NEGATIVE
PH: 7 (ref 5.0–8.0)
Protein, ur: NEGATIVE mg/dL
SPECIFIC GRAVITY, URINE: 1.016 (ref 1.005–1.030)

## 2016-07-05 LAB — CBC WITH DIFFERENTIAL/PLATELET
BASOS PCT: 1 %
Basophils Absolute: 0.1 10*3/uL (ref 0.0–0.1)
EOS ABS: 0.1 10*3/uL (ref 0.0–0.7)
EOS PCT: 1 %
HCT: 37.3 % (ref 36.0–46.0)
HEMOGLOBIN: 12.3 g/dL (ref 12.0–15.0)
Lymphocytes Relative: 33 %
Lymphs Abs: 2.9 10*3/uL (ref 0.7–4.0)
MCH: 28 pg (ref 26.0–34.0)
MCHC: 33 g/dL (ref 30.0–36.0)
MCV: 85 fL (ref 78.0–100.0)
Monocytes Absolute: 0.7 10*3/uL (ref 0.1–1.0)
Monocytes Relative: 8 %
NEUTROS PCT: 57 %
Neutro Abs: 5 10*3/uL (ref 1.7–7.7)
PLATELETS: 322 10*3/uL (ref 150–400)
RBC: 4.39 MIL/uL (ref 3.87–5.11)
RDW: 13.8 % (ref 11.5–15.5)
WBC: 8.8 10*3/uL (ref 4.0–10.5)

## 2016-07-05 LAB — URINE MICROSCOPIC-ADD ON

## 2016-07-05 LAB — BASIC METABOLIC PANEL
Anion gap: 6 (ref 5–15)
BUN: 12 mg/dL (ref 6–20)
CHLORIDE: 106 mmol/L (ref 101–111)
CO2: 26 mmol/L (ref 22–32)
CREATININE: 0.9 mg/dL (ref 0.44–1.00)
Calcium: 9.1 mg/dL (ref 8.9–10.3)
GFR calc Af Amer: >60 mL/min (ref >60)
GFR calc non Af Amer: >60 mL/min (ref >60)
GLUCOSE: 86 mg/dL (ref 65–99)
POTASSIUM: 4.1 mmol/L (ref 3.5–5.1)
Sodium: 138 mmol/L (ref 135–145)

## 2016-07-05 LAB — PREGNANCY, URINE: Preg Test, Ur: NEGATIVE

## 2016-07-05 LAB — WET PREP, GENITAL
SPERM: NONE SEEN
Trich, Wet Prep: NONE SEEN
Yeast Wet Prep HPF POC: NONE SEEN

## 2016-07-05 MED ORDER — METRONIDAZOLE 500 MG PO TABS: 500.0000 mg | ORAL_TABLET | Freq: Two times a day (BID) | ORAL | 0 refills | Status: DC

## 2016-07-05 NOTE — ED Triage Notes (Signed)
LLQ abd pain radiating to her back, denies N/V, endorses urinary frequency.

## 2016-07-05 NOTE — ED Provider Notes (Signed)
[facility] Provider Note   CSN: [id number] Arrival date & time: 07/05/16  1323  By signing my name below, I, Kristin Lucas, attest that this documentation has been prepared under the direction and in the presence of Kristin Mornavid Latravious Levitt NP  Electronically Signed: Vista Minkobert Lucas, ED Scribe. 07/05/16. 4:09 PM.   History   Chief Complaint Chief Complaint  Patient presents with  . Abdominal Pain    HPI HPI Comments: Kristin Lucas is a 35 y.o. female with no pertinent PMHx, who presents to the Emergency Department complaining of constant, achy left sided abdominal pain, onset three days ago. She states that this pain is exacerbated when she sits up from laying down and when she laughs. This pain will intermittently radiate to the left side of her back. She denies any Hx of ovarian cyst. Pt denies dysuria, vaginal discharge or bleeding. No nausea or vomiting.  The history is provided by the patient. No language interpreter was used.    History reviewed. No pertinent past medical history.  There are no active problems to display for this patient.   Past Surgical History:  Procedure Laterality Date  . CESAREAN SECTION  2007    OB History    Gravida Para Term Preterm AB Living   1 1 1     1    SAB TAB Ectopic Multiple Live Births                   Home Medications    Prior to Admission medications   Medication Sig Start Date End Date Taking? Authorizing Provider  albuterol (PROVENTIL HFA;VENTOLIN HFA) 108 (90 BASE) MCG/ACT inhaler Inhale 2 puffs into the lungs every 6 (six) hours as needed for wheezing or shortness of breath. 01/25/15   Charm RingsErin J Honig, MD  betamethasone dipropionate (DIPROLENE) 0.05 % cream  10/12/15   Historical Provider, MD  cyclobenzaprine (FLEXERIL) 10 MG tablet Take 1 tablet (10 mg total) by mouth 2 (two) times daily as needed for muscle spasms. 12/10/15   Elpidio AnisShari Upstill, PA-C  desonide (DESOWEN) 0.05 % cream  10/12/15   Historical Provider, MD    HYDROcodone-acetaminophen (NORCO/VICODIN) 5-325 MG tablet Take 1-2 tablets by mouth every 4 (four) hours as needed. 12/10/15   Elpidio AnisShari Upstill, PA-C  ibuprofen (ADVIL,MOTRIN) 800 MG tablet Take 1 tablet (800 mg total) by mouth 3 (three) times daily. 12/10/15   Elpidio AnisShari Upstill, PA-C    Family History Family History  Problem Relation Age of Onset  . Diabetes Father   . Hypertension Father     Social History Social History  Substance Use Topics  . Smoking status: Never Smoker  . Smokeless tobacco: Never Used  . Alcohol use 0.0 - 0.6 oz/week     Comment: occ     Allergies   Review of patient's allergies indicates no known allergies.   Review of Systems Review of Systems  Genitourinary: Negative for vaginal bleeding and vaginal discharge.  All other systems reviewed and are negative.    Physical Exam Updated Vital Signs BP 128/91 (BP Location: Right Arm)   Pulse 78   Temp 98.8 F (37.1 C) (Oral)   Resp 16   Wt 193 lb (87.5 kg)   LMP 06/30/2016   SpO2 100%   BMI 30.23 kg/m   Physical Exam  Constitutional: She is oriented to person, place, and time. She appears well-developed and well-nourished. No distress.  HENT:  Head: Normocephalic and atraumatic.  Neck: Normal range of motion.  Pulmonary/Chest: Effort normal.  Abdominal: There is tenderness.  LLQ tenderness.   Genitourinary:  Genitourinary Comments: Denies vaginal discharge or dysuria.  Musculoskeletal: She exhibits no tenderness.  No CVA tenderness.  Neurological: She is alert and oriented to person, place, and time.  Skin: Skin is warm and dry. She is not diaphoretic.  Psychiatric: She has a normal mood and affect. Judgment normal.  Nursing note and vitals reviewed.   ED Treatments / Results  DIAGNOSTIC STUDIES: Oxygen Saturation is 100% on RA, normal by my interpretation.  COORDINATION OF CARE: 4:12 PM-Will order blood work. Discussed treatment plan with pt at bedside and pt agreed to plan.    Labs (all labs ordered are listed, but only abnormal results are displayed) Labs Reviewed  WET PREP, GENITAL - Abnormal; Notable for the following:       Result Value   Clue Cells Wet Prep HPF POC PRESENT (*)    WBC, Wet Prep HPF POC FEW (*)    All other components within normal limits  URINALYSIS, ROUTINE W REFLEX MICROSCOPIC (NOT AT [facility]) - Abnormal; Notable for the following:    Leukocytes, UA SMALL (*)    All other components within normal limits  URINE MICROSCOPIC-ADD ON - Abnormal; Notable for the following:    Squamous Epithelial / LPF 0-5 (*)    Bacteria, UA RARE (*)    All other components within normal limits  PREGNANCY, URINE  CBC WITH DIFFERENTIAL/PLATELET  BASIC METABOLIC PANEL    EKG  EKG Interpretation None       Radiology No results found.  Procedures Procedures (including critical care time)  Medications Ordered in ED Medications - No data to display   Initial Impression / Assessment and Plan / ED Course  I have reviewed the triage vital signs and the nursing notes.  Pertinent labs & imaging results that were available during my care of the patient were reviewed by me and considered in my medical decision making (see chart for details).  Clinical Course  Patient treated in the ED for bacterial vaginosis. Patient understands that they have GC/Chlamydia cultures pending and will result in 2-3 days.  No concern for PID. Discussed return precautions. Pt appears safe for discharge.     Final Clinical Impressions(s) / ED Diagnoses   Final diagnoses:  Pelvic pain in female  BV (bacterial vaginosis)    New Prescriptions Discharge Medication List as of 07/05/2016  6:42 PM    START taking these medications   Details  metroNIDAZOLE (FLAGYL) 500 MG tablet Take 1 tablet (500 mg total) by mouth 2 (two) times daily. One po bid x 7 days, Starting Thu 07/05/2016, Print      I personally performed the services described in this documentation, which was  scribed in my presence. The recorded information has been reviewed and is accurate.     Kristin Morn, NP 07/06/16 0123    Melene Plan, DO 07/07/16 1829

## 2016-07-06 LAB — GC/CHLAMYDIA PROBE AMP (~~LOC~~) NOT AT ARMC
Chlamydia: NEGATIVE
Neisseria Gonorrhea: NEGATIVE

## 2016-07-24 ENCOUNTER — Ambulatory Visit: Payer: PRIVATE HEALTH INSURANCE | Admitting: Certified Nurse Midwife

## 2018-06-20 ENCOUNTER — Emergency Department (HOSPITAL_BASED_OUTPATIENT_CLINIC_OR_DEPARTMENT_OTHER)
Admission: EM | Admit: 2018-06-20 | Discharge: 2018-06-20 | Disposition: A | Discharge status: Home / Self Care | Payer: Medicaid Other | Attending: Emergency Medicine | Admitting: Emergency Medicine

## 2018-06-20 ENCOUNTER — Other Ambulatory Visit: Payer: Self-pay

## 2018-06-20 ENCOUNTER — Emergency Department (HOSPITAL_BASED_OUTPATIENT_CLINIC_OR_DEPARTMENT_OTHER): Payer: Medicaid Other

## 2018-06-20 ENCOUNTER — Encounter (HOSPITAL_BASED_OUTPATIENT_CLINIC_OR_DEPARTMENT_OTHER): Payer: Self-pay

## 2018-06-20 DIAGNOSIS — Z79899 Other long term (current) drug therapy: Secondary | ICD-10-CM | POA: Diagnosis not present

## 2018-06-20 DIAGNOSIS — M546 Pain in thoracic spine: Secondary | ICD-10-CM

## 2018-06-20 MED ORDER — ACETAMINOPHEN 325 MG PO TABS
650.0000 mg | ORAL_TABLET | Freq: Once | ORAL | Status: AC
  Administered 2018-06-20: 650 mg via ORAL
  Filled 2018-06-20: qty 2

## 2018-06-20 MED ORDER — IBUPROFEN 600 MG PO TABS: 600.0000 mg | ORAL_TABLET | Freq: Four times a day (QID) | ORAL | 0 refills | Status: DC | PRN

## 2018-06-20 MED ORDER — METHOCARBAMOL 500 MG PO TABS: 500.0000 mg | ORAL_TABLET | Freq: Two times a day (BID) | ORAL | 0 refills | Status: DC

## 2018-06-20 MED ORDER — METHOCARBAMOL 500 MG PO TABS
500.0000 mg | ORAL_TABLET | Freq: Once | ORAL | Status: AC
  Administered 2018-06-20: 500 mg via ORAL
  Filled 2018-06-20: qty 1
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
## 2019-05-11 ENCOUNTER — Other Ambulatory Visit: Payer: Self-pay | Admitting: Internal Medicine

## 2019-05-11 DIAGNOSIS — N644 Mastodynia: Secondary | ICD-10-CM

## 2019-05-18 ENCOUNTER — Other Ambulatory Visit: Payer: Self-pay

## 2019-05-18 ENCOUNTER — Ambulatory Visit: Payer: Medicaid Other

## 2019-05-18 ENCOUNTER — Ambulatory Visit
Admission: RE | Admit: 2019-05-18 | Discharge: 2019-05-18 | Disposition: A | Discharge status: Home / Self Care | Payer: Medicaid Other | Source: Ambulatory Visit | Attending: Internal Medicine | Admitting: Internal Medicine

## 2019-05-18 DIAGNOSIS — N644 Mastodynia: Secondary | ICD-10-CM

## 2019-08-18 ENCOUNTER — Telehealth (INDEPENDENT_AMBULATORY_CARE_PROVIDER_SITE_OTHER): Payer: Medicaid Other

## 2019-08-18 DIAGNOSIS — Z3401 Encounter for supervision of normal first pregnancy, first trimester: Secondary | ICD-10-CM

## 2019-08-18 DIAGNOSIS — O0990 Supervision of high risk pregnancy, unspecified, unspecified trimester: Secondary | ICD-10-CM | POA: Insufficient documentation

## 2019-08-18 MED ORDER — BLOOD PRESSURE KIT: PACK | 0 refills | Status: DC

## 2019-08-18 MED ORDER — BLOOD PRESSURE MONITOR/L CUFF MISC: 0 refills | Status: DC

## 2019-08-18 NOTE — Progress Notes (Addendum)
I connected with patient via phone. This is a new ob intake consult. LMP 06/03/19. Leaving patient at 10 weeks 4 days. We have no record of a confirmed pregnancy.  She reports having testing done at [facility] and also at [facility]. [facility] states that they have no record of seeing the patient. She will need to fill out an DPR for records to be sent from the [facility]. She can sign forms at her next appointment on 08/31/19. -EH/RMA

## 2019-08-24 ENCOUNTER — Inpatient Hospital Stay (HOSPITAL_COMMUNITY)
Admission: AD | Admit: 2019-08-24 | Discharge: 2019-08-24 | Disposition: A | Discharge status: Home / Self Care | Payer: Medicaid Other | Attending: Obstetrics & Gynecology | Admitting: Obstetrics & Gynecology

## 2019-08-24 ENCOUNTER — Telehealth: Payer: Self-pay | Admitting: Advanced Practice Midwife

## 2019-08-24 ENCOUNTER — Other Ambulatory Visit: Payer: Self-pay

## 2019-08-24 ENCOUNTER — Encounter (HOSPITAL_COMMUNITY): Payer: Self-pay | Admitting: *Deleted

## 2019-08-24 DIAGNOSIS — Z791 Long term (current) use of non-steroidal anti-inflammatories (NSAID): Secondary | ICD-10-CM | POA: Insufficient documentation

## 2019-08-24 DIAGNOSIS — R519 Headache, unspecified: Secondary | ICD-10-CM | POA: Diagnosis not present

## 2019-08-24 DIAGNOSIS — Z3A11 11 weeks gestation of pregnancy: Secondary | ICD-10-CM | POA: Insufficient documentation

## 2019-08-24 DIAGNOSIS — O10911 Unspecified pre-existing hypertension complicating pregnancy, first trimester: Secondary | ICD-10-CM | POA: Diagnosis not present

## 2019-08-24 DIAGNOSIS — O10011 Pre-existing essential hypertension complicating pregnancy, first trimester: Secondary | ICD-10-CM | POA: Diagnosis not present

## 2019-08-24 DIAGNOSIS — O26891 Other specified pregnancy related conditions, first trimester: Secondary | ICD-10-CM | POA: Diagnosis present

## 2019-08-24 LAB — POCT PREGNANCY, URINE: Preg Test, Ur: POSITIVE — AB

## 2019-08-24 MED ORDER — LABETALOL HCL 200 MG PO TABS: 200.0000 mg | ORAL_TABLET | Freq: Two times a day (BID) | ORAL | 0 refills | Status: DC

## 2019-08-24 MED ORDER — METOCLOPRAMIDE HCL 10 MG PO TABS
10.0000 mg | ORAL_TABLET | Freq: Once | ORAL | Status: AC
  Administered 2019-08-24: 16:00:00 10 mg via ORAL
  Filled 2019-08-24: qty 1

## 2019-08-24 MED ORDER — NIFEDIPINE 10 MG PO CAPS
10.0000 mg | ORAL_CAPSULE | Freq: Once | ORAL | Status: AC
  Administered 2019-08-24: 16:00:00 10 mg via ORAL
  Filled 2019-08-24: qty 1

## 2019-08-24 MED ORDER — ACETAMINOPHEN 500 MG PO TABS
1000.0000 mg | ORAL_TABLET | Freq: Once | ORAL | Status: AC
  Administered 2019-08-24: 1000 mg via ORAL
  Filled 2019-08-24: qty 2

## 2019-08-24 MED ORDER — KETOROLAC TROMETHAMINE 60 MG/2ML IM SOLN
30.0000 mg | Freq: Once | INTRAMUSCULAR | Status: AC
Start: 2019-08-24 — End: 2019-08-24
  Administered 2019-08-24: 30 mg via INTRAMUSCULAR
  Filled 2019-08-24: qty 2

## 2019-08-24 MED ORDER — CYCLOBENZAPRINE HCL 5 MG PO TABS: 5.0000 mg | ORAL_TABLET | Freq: Three times a day (TID) | ORAL | 0 refills | Status: DC | PRN

## 2019-08-24 NOTE — Progress Notes (Signed)
I have reviewed this chart and agree with the RN/CMA assessment and management.    K. Meryl Davis, M.D. Attending [facility] (Faculty Practice)   

## 2019-08-24 NOTE — Discharge Instructions (Signed)
Hypertension During Pregnancy °High blood pressure (hypertension) is when the force of blood pumping through the arteries is too strong. Arteries are blood vessels that carry blood from the heart throughout the body. Hypertension during pregnancy can be mild or severe. Severe hypertension during pregnancy (preeclampsia) is a medical emergency that requires prompt evaluation and treatment. °Different types of hypertension can happen during pregnancy. These include: °· Chronic hypertension. This happens when you had high blood pressure before you became pregnant, and it continues during the pregnancy. Hypertension that develops before you are [redacted] weeks pregnant and continues during the pregnancy is also called chronic hypertension. If you have chronic hypertension, it will not go away after you have your baby. You will need follow-up visits with your health care provider after you have your baby. Your doctor may want you to keep taking medicine for your blood pressure. °· Gestational hypertension. This is hypertension that develops after the 20th week of pregnancy. Gestational hypertension usually goes away after you have your baby, but your health care provider will need to monitor your blood pressure to make sure that it is getting better. °· Preeclampsia. This is severe hypertension during pregnancy. This can cause serious complications for you and your baby and can also cause complications for you after the delivery of your baby. °· Postpartum preeclampsia. You may develop severe hypertension after giving birth. This usually occurs within 48 hours after childbirth but may occur up to 6 weeks after giving birth. This is rare. °How does this affect me? °Women who have hypertension during pregnancy have a greater chance of developing hypertension later in life or during future pregnancies. In some cases, hypertension during pregnancy can cause serious complications, such as: °· Stroke. °· Heart attack. °· Injury to  other organs, such as kidneys, lungs, or liver. °· Preeclampsia. °· Convulsions or seizures. °· Placental abruption. °How does this affect my baby? °Hypertension during pregnancy can affect your baby. Your baby may: °· Be born early (prematurely). °· Not weigh as much as he or she should at birth (low birth weight). °· Not tolerate labor well, leading to an unplanned cesarean delivery. °What are the risks? °There are certain factors that make it more likely for you to develop hypertension during pregnancy. These include: °· Having hypertension during a previous pregnancy. °· Being overweight. °· Being age 35 or older. °· Being pregnant for the first time. °· Being pregnant with more than one baby. °· Becoming pregnant using fertilization methods, such as IVF (in vitro fertilization). °· Having other medical problems, such as diabetes, kidney disease, or lupus. °· Having a family history of hypertension. °What can I do to lower my risk? °The exact cause of hypertension during pregnancy is not known. You may be able to lower your risk by: °· Maintaining a healthy weight. °· Eating a healthy and balanced diet. °· Following your health care provider's instructions about treating any long-term conditions that you had before becoming pregnant. °It is very important to keep all of your prenatal care appointments. Your health care provider will check your blood pressure and make sure that your pregnancy is progressing as expected. If a problem is found, early treatment can prevent complications. °How is this treated? °Treatment for hypertension during pregnancy varies depending on the type of hypertension you have and how serious it is. °· If you were taking medicine for high blood pressure before you became pregnant, talk with your health care provider. You may need to change medicine during pregnancy because   some medicines, like ACE inhibitors, may not be considered safe for your baby.  If you have gestational  hypertension, your health care provider may order medicine to treat this during pregnancy.  If you are at risk for preeclampsia, your health care provider may recommend that you take a low-dose aspirin during your pregnancy.  If you have severe hypertension, you may need to be hospitalized so you and your baby can be monitored closely. You may also need to be given medicine to lower your blood pressure. This medicine may be given by mouth or through an IV.  In some cases, if your condition gets worse, you may need to deliver your baby early. Follow these instructions at home: Eating and drinking   Drink enough fluid to keep your urine pale yellow.  Avoid caffeine. Lifestyle  Do not use any products that contain nicotine or tobacco, such as cigarettes, e-cigarettes, and chewing tobacco. If you need help quitting, ask your health care provider.  Do not use alcohol or drugs.  Avoid stress as much as possible.  Rest and get plenty of sleep.  Regular exercise can help to reduce your blood pressure. Ask your health care provider what kinds of exercise are best for you. General instructions  Take over-the-counter and prescription medicines only as told by your health care provider.  Keep all prenatal and follow-up visits as told by your health care provider. This is important. Contact a health care provider if:  You have symptoms that your health care provider told you may require more treatment or monitoring, such as: ? Headaches. ? Nausea or vomiting. ? Abdominal pain. ? Dizziness. ? Light-headedness. Get help right away if:  You have: ? Severe abdominal pain that does not get better with treatment. ? A severe headache that does not get better. ? Vomiting that does not get better. ? Sudden, rapid weight gain. ? Sudden swelling in your hands, ankles, or face. ? Vaginal bleeding. ? Blood in your urine. ? Blurred or double vision. ? Shortness of breath or chest  pain. ? Weakness on one side of your body. ? Difficulty speaking.  Your baby is not moving as much as usual. Summary  High blood pressure (hypertension) is when the force of blood pumping through the arteries is too strong.  Hypertension during pregnancy can cause problems for you and your baby.  Treatment for hypertension during pregnancy varies depending on the type of hypertension you have and how serious it is.  Keep all prenatal and follow-up visits as told by your health care provider. This is important. This information is not intended to replace advice given to you by your health care provider. Make sure you discuss any questions you have with your health care provider. Document Released: 06/12/2011 Document Revised: 01/15/2019 Document Reviewed: 10/21/2018 Elsevier Patient Education  2020 [place] Headache Without Cause A headache is pain or discomfort felt around the head or neck area. The specific cause of a headache may not be found. There are many causes and types of headaches. A few common ones are:  Tension headaches.  Migraine headaches.  Cluster headaches.  Chronic daily headaches. Follow these instructions at home: Watch your condition for any changes. Let your health care provider know about them. Take these steps to help with your condition: Managing pain      Take over-the-counter and prescription medicines only as told by your health care provider.  Lie down in a dark, quiet room when you  have a headache.  If directed, put ice on your head and neck area: ? Put ice in a plastic bag. ? Place a towel between your skin and the bag. ? Leave the ice on for 20 minutes, 2-3 times per day.  If directed, apply heat to the affected area. Use the heat source that your health care provider recommends, such as a moist heat pack or a heating pad. ? Place a towel between your skin and the heat source. ? Leave the heat on for 20-30  minutes. ? Remove the heat if your skin turns bright red. This is especially important if you are unable to feel pain, heat, or cold. You may have a greater risk of getting burned.  Keep lights dim if bright lights bother you or make your headaches worse. Eating and drinking  Eat meals on a regular schedule.  If you drink alcohol: ? Limit how much you use to:  0-1 drink a day for women.  0-2 drinks a day for men. ? Be aware of how much alcohol is in your drink. In the [place] one drink equals one 12 oz bottle of beer (355 mL), one 5 oz glass of wine (148 mL), or one 1 oz glass of hard liquor (44 mL).  Stop drinking caffeine, or decrease the amount of caffeine you drink. General instructions   Keep a headache journal to help find out what may trigger your headaches. For example, write down: ? What you eat and drink. ? How much sleep you get. ? Any change to your diet or medicines.  Try massage or other relaxation techniques.  Limit stress.  Sit up straight, and do not tense your muscles.  Do not use any products that contain nicotine or tobacco, such as cigarettes, e-cigarettes, and chewing tobacco. If you need help quitting, ask your health care provider.  Exercise regularly as told by your health care provider.  Sleep on a regular schedule. Get 7-9 hours of sleep each night, or the amount recommended by your health care provider.  Keep all follow-up visits as told by your health care provider. This is important. Contact a health care provider if:  Your symptoms are not helped by medicine.  You have a headache that is different from the usual headache.  You have nausea or you vomit.  You have a fever. Get help right away if:  Your headache becomes severe quickly.  Your headache gets worse after moderate to intense physical activity.  You have repeated vomiting.  You have a stiff neck.  You have a loss of vision.  You have problems with speech.  You have  pain in the eye or ear.  You have muscular weakness or loss of muscle control.  You lose your balance or have trouble walking.  You feel faint or pass out.  You have confusion.  You have a seizure. Summary  A headache is pain or discomfort felt around the head or neck area.  There are many causes and types of headaches. In some cases, the cause may not be found.  Keep a headache journal to help find out what may trigger your headaches. Watch your condition for any changes. Let your health care provider know about them.  Contact a health care provider if you have a headache that is different from the usual headache, or if your symptoms are not helped by medicine.  Get help right away if your headache becomes severe, you vomit, you have a loss of  vision, you lose your balance, or you have a seizure. This information is not intended to replace advice given to you by your health care provider. Make sure you discuss any questions you have with your health care provider. Document Released: 09/24/2005 Document Revised: 04/14/2018 Document Reviewed: 04/14/2018 Elsevier Patient Education  2020 [employer]

## 2019-08-24 NOTE — MAU Provider Note (Signed)
Chief Complaint: Headache and Hypertension   First Provider Initiated Contact with Patient 08/24/19 1603     SUBJECTIVE HPI: Kristin Lucas is a 38 y.o. G2P1001 at 34w5dwho presents to Maternity Admissions reporting headache & hypertension.  Reports history of hypertension but hasn't been on medication recently. States she was on meds for a short period of time but then her doctor discontinued them because her BPs got so much better.  Called the office today & reports an at home BP of 190s/120s, was directed to MAU.  Reports throbbing headache x 3 days. Has history of migraines. Has been taking tylenol without relief. Denies chest pain, SOB, numbness/tingling, or fever.  No OB complaints.   Location: head Quality: throbbing Severity: 10/10 on pain scale Duration: 3 days Timing: constant Modifying factors: worse with lights. Not improved with tylenol Associated signs and symptoms: hypertension  History reviewed. No pertinent past medical history. OB History  Gravida Para Term Preterm AB Living  '2 1 1     1  ' SAB TAB Ectopic Multiple Live Births               # Outcome Date GA Lbr Len/2nd Weight Sex Delivery Anes PTL Lv  2 Current           1 Term 07/05/06 [redacted]w[redacted]d M CS-Unspec      Past Surgical History:  Procedure Laterality Date  . CESAREAN SECTION  2007  . LIPOSUCTION     Social History   Socioeconomic History  . Marital status: Single    Spouse name: Not on file  . Number of children: Not on file  . Years of education: Not on file  . Highest education level: Not on file  Occupational History  . Not on file  Social Needs  . Financial resource strain: Not on file  . Food insecurity    Worry: Not on file    Inability: Not on file  . Transportation needs    Medical: Not on file    Non-medical: Not on file  Tobacco Use  . Smoking status: Never Smoker  . Smokeless tobacco: Never Used  Substance and Sexual Activity  . Alcohol use: Yes    Comment: occ  . Drug use: No   . Sexual activity: Yes    Partners: Male    Birth control/protection: None  Lifestyle  . Physical activity    Days per week: Not on file    Minutes per session: Not on file  . Stress: Not on file  Relationships  . Social coHerbalistn phone: Not on file    Gets together: Not on file    Attends religious service: Not on file    Active member of club or organization: Not on file    Attends meetings of clubs or organizations: Not on file    Relationship status: Not on file  . Intimate partner violence    Fear of current or ex partner: Not on file    Emotionally abused: Not on file    Physically abused: Not on file    Forced sexual activity: Not on file  Other Topics Concern  . Not on file  Social History Narrative  . Not on file   Family History  Problem Relation Age of Onset  . BRCA 1/2 Mother 5868. Diabetes Father   . Hypertension Father   . BRCA 1/2 Paternal Grandmother    No current facility-administered medications on file prior  to encounter.    Current Outpatient Medications on File Prior to Encounter  Medication Sig Dispense Refill  . albuterol (PROVENTIL HFA;VENTOLIN HFA) 108 (90 BASE) MCG/ACT inhaler Inhale 2 puffs into the lungs every 6 (six) hours as needed for wheezing or shortness of breath. 1 Inhaler 0  . Blood Pressure KIT Use as directed 1 kit 0  . Blood Pressure Monitoring (BLOOD PRESSURE MONITOR/L CUFF) MISC Use as directed 1 each 0  . Prenatal Vit-Fe Fumarate-FA (PRENATAL MULTIVITAMIN) TABS tablet Take 1 tablet by mouth daily at 12 noon.     No Known Allergies  I have reviewed patient's Past Medical Hx, Surgical Hx, Family Hx, Social Hx, medications and allergies.   Review of Systems  Constitutional: Negative.   Eyes: Positive for photophobia.  Respiratory: Negative.   Cardiovascular: Negative.   Gastrointestinal: Negative.   Neurological: Positive for headaches.    OBJECTIVE Patient Vitals for the past 24 hrs:  BP Temp Pulse Resp   08/24/19 1839 (!) 150/88 - - -  08/24/19 1654 (!) 152/80 - 87 -  08/24/19 1602 (!) 118/91 - 92 -  08/24/19 1546 (!) 147/88 - 87 -  08/24/19 1536 (!) 149/85 - 92 -  08/24/19 1524 (!) 194/100 98.4 F (36.9 C) (!) 104 16   Constitutional: Well-developed, well-nourished female in no acute distress.  Cardiovascular: normal rate & rhythm, no murmur Respiratory: normal rate and effort. Lung sounds clear throughout GI: Abd soft, non-tender, Pos BS x 4. No guarding or rebound tenderness MS: Extremities nontender, no edema, normal ROM Neurologic: Alert and oriented x 4.    LAB RESULTS Results for orders placed or performed during the hospital encounter of 08/24/19 (from the past 24 hour(s))  Pregnancy, urine POC     Status: Abnormal   Collection Time: 08/24/19  3:24 PM  Result Value Ref Range   Preg Test, Ur POSITIVE (A) NEGATIVE    IMAGING No results found.  MAU COURSE Orders Placed This Encounter  Procedures  . Pregnancy, urine POC  . Discharge patient   Meds ordered this encounter  Medications  . NIFEdipine (PROCARDIA) capsule 10 mg  . acetaminophen (TYLENOL) tablet 1,000 mg  . metoCLOPramide (REGLAN) tablet 10 mg  . ketorolac (TORADOL) injection 30 mg  . labetalol (NORMODYNE) 200 MG tablet    Sig: Take 1 tablet (200 mg total) by mouth 2 (two) times daily.    Dispense:  60 tablet    Refill:  0    Order Specific Question:   Supervising Provider    Answer:   Verita Schneiders A [[id number]]  . cyclobenzaprine (FLEXERIL) 5 MG tablet    Sig: Take 1 tablet (5 mg total) by mouth 3 (three) times daily as needed for muscle spasms.    Dispense:  20 tablet    Refill:  0    Order Specific Question:   Supervising Provider    Answer:   Verita Schneiders A [[id number]]    MDM FHT present via doppler Pt has no OB complaints  Elevated BPs. No neuro symptoms. No CP. Headache present. Consistent with previous headaches.  Htn & headache treated with procardia 10 mg, tylenol 1gm, & reglan 10 mg.   BPs responded well to procardia.   Headache with minimal improvement after h/a cocktail. Pt cannot get ride home. Will give dose of toradol 30 mg IM x1.  Pt reports resolution of headache.   ASSESSMENT 1. Chronic hypertension complicating or reason for care during pregnancy, first trimester   2. [redacted]  weeks gestation of pregnancy   3. Acute nonintractable headache, unspecified headache type     PLAN Discharge home in stable condition. Discussed reasons to return to MAU Rx labetalol 200 BID Rx flexeril prn to take with tylenol for future headaches.  Keep new ob appt scheduled next week  Follow-up Information    [facility] Follow up.   Specialty: Obstetrics and Gynecology Why: return for worsening symptoms Contact information: [address] [phone] [place] [phone]         Allergies as of 08/24/2019   No Known Allergies     Medication List    STOP taking these medications   betamethasone dipropionate 0.05 % cream   desonide 0.05 % cream Commonly known as: DESOWEN   HYDROcodone-acetaminophen 5-325 MG tablet Commonly known as: NORCO/VICODIN   ibuprofen 600 MG tablet Commonly known as: ADVIL   methocarbamol 500 MG tablet Commonly known as: ROBAXIN   metroNIDAZOLE 500 MG tablet Commonly known as: Flagyl     TAKE these medications   albuterol 108 (90 Base) MCG/ACT inhaler Commonly known as: VENTOLIN HFA Inhale 2 puffs into the lungs every 6 (six) hours as needed for wheezing or shortness of breath.   Blood Pressure Kit Use as directed   Blood Pressure Monitor/L Cuff Misc Use as directed   cyclobenzaprine 5 MG tablet Commonly known as: FLEXERIL Take 1 tablet (5 mg total) by mouth 3 (three) times daily as needed for muscle spasms. What changed:   medication strength  how much to take  when to take this   labetalol 200 MG tablet Commonly known as: NORMODYNE Take 1 tablet (200 mg  total) by mouth 2 (two) times daily.   prenatal multivitamin Tabs tablet Take 1 tablet by mouth daily at 12 noon.        Jorje Guild, NP 08/24/2019  7:00 PM

## 2019-08-24 NOTE — MAU Note (Signed)
.   Kristin Lucas is a 38 y.o. at [redacted]w[redacted]d here in MAU reporting: headache for three days, Stated she took her blood pressure at home and it was elevated LMP:06/03/19 Onset of complaint: 3 days Pain score: 10 Vitals:   08/24/19 1546 08/24/19 1602  BP: (!) 147/88 (!) 118/91  Pulse: 87 92  Resp:    Temp:       FHT:158 Lab orders placed from triage:

## 2019-08-24 NOTE — Telephone Encounter (Signed)
Patient called to verify which pharmacy her BP cuff was sent to because she was having headaches wanted to know if it were related to her BP.  She is not currently on BP meds.  She has not had her new ob visit with [place] yet.    Patient called back after picking up her cuff and BP was 199/121.  With severe BP and headaches, patient was instructed to go to MAU at [facility].

## 2019-08-31 ENCOUNTER — Ambulatory Visit (INDEPENDENT_AMBULATORY_CARE_PROVIDER_SITE_OTHER): Payer: Medicaid Other | Admitting: Advanced Practice Midwife

## 2019-08-31 ENCOUNTER — Other Ambulatory Visit (HOSPITAL_COMMUNITY)
Admission: RE | Admit: 2019-08-31 | Discharge: 2019-08-31 | Disposition: A | Discharge status: Home / Self Care | Payer: Medicaid Other | Source: Ambulatory Visit | Attending: Advanced Practice Midwife | Admitting: Advanced Practice Midwife

## 2019-08-31 ENCOUNTER — Other Ambulatory Visit: Payer: Self-pay

## 2019-08-31 VITALS — BP 153/92 | HR 93 | Wt 210.0 lb

## 2019-08-31 DIAGNOSIS — O0991 Supervision of high risk pregnancy, unspecified, first trimester: Secondary | ICD-10-CM

## 2019-08-31 DIAGNOSIS — O10911 Unspecified pre-existing hypertension complicating pregnancy, first trimester: Secondary | ICD-10-CM

## 2019-08-31 DIAGNOSIS — O0990 Supervision of high risk pregnancy, unspecified, unspecified trimester: Secondary | ICD-10-CM

## 2019-08-31 DIAGNOSIS — R7309 Other abnormal glucose: Secondary | ICD-10-CM

## 2019-08-31 DIAGNOSIS — O10919 Unspecified pre-existing hypertension complicating pregnancy, unspecified trimester: Secondary | ICD-10-CM | POA: Insufficient documentation

## 2019-08-31 DIAGNOSIS — Z3A12 12 weeks gestation of pregnancy: Secondary | ICD-10-CM

## 2019-08-31 DIAGNOSIS — O99611 Diseases of the digestive system complicating pregnancy, first trimester: Secondary | ICD-10-CM

## 2019-08-31 DIAGNOSIS — O34219 Maternal care for unspecified type scar from previous cesarean delivery: Secondary | ICD-10-CM

## 2019-08-31 DIAGNOSIS — K5900 Constipation, unspecified: Secondary | ICD-10-CM

## 2019-08-31 DIAGNOSIS — Z803 Family history of malignant neoplasm of breast: Secondary | ICD-10-CM | POA: Insufficient documentation

## 2019-08-31 MED ORDER — POLYETHYLENE GLYCOL 3350 17 GM/SCOOP PO POWD: 17.0000 g | Freq: Every day | ORAL | 0 refills | Status: DC

## 2019-08-31 MED ORDER — DOCUSATE SODIUM 100 MG PO CAPS: 100.0000 mg | ORAL_CAPSULE | Freq: Two times a day (BID) | ORAL | 2 refills | Status: DC | PRN

## 2019-08-31 MED ORDER — ASPIRIN EC 81 MG PO TBEC: 81.0000 mg | DELAYED_RELEASE_TABLET | Freq: Every day | ORAL | 10 refills | Status: DC

## 2019-08-31 NOTE — Patient Instructions (Signed)
Hypertension During Pregnancy High blood pressure (hypertension) is when the force of blood pumping through the arteries is too strong. Arteries are blood vessels that carry blood from the heart throughout the body. Hypertension during pregnancy can be mild or severe. Severe hypertension during pregnancy (preeclampsia) is a medical emergency that requires prompt evaluation and treatment. Different types of hypertension can happen during pregnancy. These include:  Chronic hypertension. This happens when you had high blood pressure before you became pregnant, and it continues during the pregnancy. Hypertension that develops before you are [redacted] weeks pregnant and continues during the pregnancy is also called chronic hypertension. If you have chronic hypertension, it will not go away after you have your baby. You will need follow-up visits with your health care provider after you have your baby. Your doctor may want you to keep taking medicine for your blood pressure.  Gestational hypertension. This is hypertension that develops after the 20th week of pregnancy. Gestational hypertension usually goes away after you have your baby, but your health care provider will need to monitor your blood pressure to make sure that it is getting better.  Preeclampsia. This is severe hypertension during pregnancy. This can cause serious complications for you and your baby and can also cause complications for you after the delivery of your baby.  Postpartum preeclampsia. You may develop severe hypertension after giving birth. This usually occurs within 48 hours after childbirth but may occur up to 6 weeks after giving birth. This is rare. How does this affect me? Women who have hypertension during pregnancy have a greater chance of developing hypertension later in life or during future pregnancies. In some cases, hypertension during pregnancy can cause serious complications, such as:  Stroke.  Heart attack.  Injury to  other organs, such as kidneys, lungs, or liver.  Preeclampsia.  Convulsions or seizures.  Placental abruption. How does this affect my baby? Hypertension during pregnancy can affect your baby. Your baby may:  Be born early (prematurely).  Not weigh as much as he or she should at birth (low birth weight).  Not tolerate labor well, leading to an unplanned cesarean delivery. What are the risks? There are certain factors that make it more likely for you to develop hypertension during pregnancy. These include:  Having hypertension during a previous pregnancy.  Being overweight.  Being age 35 or older.  Being pregnant for the first time.  Being pregnant with more than one baby.  Becoming pregnant using fertilization methods, such as IVF (in vitro fertilization).  Having other medical problems, such as diabetes, kidney disease, or lupus.  Having a family history of hypertension. What can I do to lower my risk? The exact cause of hypertension during pregnancy is not known. You may be able to lower your risk by:  Maintaining a healthy weight.  Eating a healthy and balanced diet.  Following your health care provider's instructions about treating any long-term conditions that you had before becoming pregnant. It is very important to keep all of your prenatal care appointments. Your health care provider will check your blood pressure and make sure that your pregnancy is progressing as expected. If a problem is found, early treatment can prevent complications. How is this treated? Treatment for hypertension during pregnancy varies depending on the type of hypertension you have and how serious it is.  If you were taking medicine for high blood pressure before you became pregnant, talk with your health care provider. You may need to change medicine during pregnancy because   some medicines, like ACE inhibitors, may not be considered safe for your baby.  If you have gestational  hypertension, your health care provider may order medicine to treat this during pregnancy.  If you are at risk for preeclampsia, your health care provider may recommend that you take a low-dose aspirin during your pregnancy.  If you have severe hypertension, you may need to be hospitalized so you and your baby can be monitored closely. You may also need to be given medicine to lower your blood pressure. This medicine may be given by mouth or through an IV.  In some cases, if your condition gets worse, you may need to deliver your baby early. Follow these instructions at home: Eating and drinking   Drink enough fluid to keep your urine pale yellow.  Avoid caffeine. Lifestyle  Do not use any products that contain nicotine or tobacco, such as cigarettes, e-cigarettes, and chewing tobacco. If you need help quitting, ask your health care provider.  Do not use alcohol or drugs.  Avoid stress as much as possible.  Rest and get plenty of sleep.  Regular exercise can help to reduce your blood pressure. Ask your health care provider what kinds of exercise are best for you. General instructions  Take over-the-counter and prescription medicines only as told by your health care provider.  Keep all prenatal and follow-up visits as told by your health care provider. This is important. Contact a health care provider if:  You have symptoms that your health care provider told you may require more treatment or monitoring, such as: ? Headaches. ? Nausea or vomiting. ? Abdominal pain. ? Dizziness. ? Light-headedness. Get help right away if:  You have: ? Severe abdominal pain that does not get better with treatment. ? A severe headache that does not get better. ? Vomiting that does not get better. ? Sudden, rapid weight gain. ? Sudden swelling in your hands, ankles, or face. ? Vaginal bleeding. ? Blood in your urine. ? Blurred or double vision. ? Shortness of breath or chest pain. ?  Weakness on one side of your body. ? Difficulty speaking.  Your baby is not moving as much as usual. Summary  High blood pressure (hypertension) is when the force of blood pumping through the arteries is too strong.  Hypertension during pregnancy can cause problems for you and your baby.  Treatment for hypertension during pregnancy varies depending on the type of hypertension you have and how serious it is.  Keep all prenatal and follow-up visits as told by your health care provider. This is important. This information is not intended to replace advice given to you by your health care provider. Make sure you discuss any questions you have with your health care provider. Document Released: 06/12/2011 Document Revised: 01/15/2019 Document Reviewed: 10/21/2018 Elsevier Patient Education  2020 [employer].  Constipation, Adult Constipation is when a person has fewer bowel movements in a week than normal, has difficulty having a bowel movement, or has stools that are dry, hard, or larger than normal. Constipation may be caused by an underlying condition. It may become worse with age if a person takes certain medicines and does not take in enough fluids. Follow these instructions at home: Eating and drinking   Eat foods that have a lot of fiber, such as fresh fruits and vegetables, whole grains, and beans.  Limit foods that are high in fat, low in fiber, or overly processed, such as french fries, hamburgers, cookies, candies, and soda.  Drink enough fluid to keep your urine clear or pale yellow. General instructions  Exercise regularly or as told by your health care provider.  Go to the restroom when you have the urge to go. Do not hold it in.  Take over-the-counter and prescription medicines only as told by your health care provider. These include any fiber supplements.  Practice pelvic floor retraining exercises, such as deep breathing while relaxing the lower abdomen and pelvic  floor relaxation during bowel movements.  Watch your condition for any changes.  Keep all follow-up visits as told by your health care provider. This is important. Contact a health care provider if:  You have pain that gets worse.  You have a fever.  You do not have a bowel movement after 4 days.  You vomit.  You are not hungry.  You lose weight.  You are bleeding from the anus.  You have thin, pencil-like stools. Get help right away if:  You have a fever and your symptoms suddenly get worse.  You leak stool or have blood in your stool.  Your abdomen is bloated.  You have severe pain in your abdomen.  You feel dizzy or you faint. This information is not intended to replace advice given to you by your health care provider. Make sure you discuss any questions you have with your health care provider. Document Released: 06/22/2004 Document Revised: 09/06/2017 Document Reviewed: 03/14/2016 Elsevier Patient Education  2020 [employer].

## 2019-08-31 NOTE — Progress Notes (Signed)
Subjective:   Kristin Lucas is a 38 y.o. G2P1001 at 19w5dby LMP being seen today for her first obstetrical visit.  Her obstetrical history is significant for Previous C/S x 1  and has Supervision of high risk pregnancy, antepartum and Chronic hypertension affecting pregnancy on their problem list.. Patient does intend to breast feed. Pregnancy history fully reviewed.  Patient reports no complaints.  HISTORY: OB History  Gravida Para Term Preterm AB Living  '2 1 1 ' 0 0 1  SAB TAB Ectopic Multiple Live Births  0 0 0 0 0    # Outcome Date GA Lbr Len/2nd Weight Sex Delivery Anes PTL Lv  2 Current           1 Term 07/05/06 419w0d M CS-Unspec      No past medical history on file. Past Surgical History:  Procedure Laterality Date  . CESAREAN SECTION  2007  . LIPOSUCTION     Family History  Problem Relation Age of Onset  . BRCA 1/2 Mother 5875. Diabetes Father   . Hypertension Father   . BRCA 1/2 Paternal Grandmother    Social History   Tobacco Use  . Smoking status: Never Smoker  . Smokeless tobacco: Never Used  Substance Use Topics  . Alcohol use: Yes    Comment: occ  . Drug use: No   No Known Allergies Current Outpatient Medications on File Prior to Visit  Medication Sig Dispense Refill  . albuterol (PROVENTIL HFA;VENTOLIN HFA) 108 (90 BASE) MCG/ACT inhaler Inhale 2 puffs into the lungs every 6 (six) hours as needed for wheezing or shortness of breath. 1 Inhaler 0  . Blood Pressure KIT Use as directed 1 kit 0  . Blood Pressure Monitoring (BLOOD PRESSURE MONITOR/L CUFF) MISC Use as directed 1 each 0  . cyclobenzaprine (FLEXERIL) 5 MG tablet Take 1 tablet (5 mg total) by mouth 3 (three) times daily as needed for muscle spasms. 20 tablet 0  . labetalol (NORMODYNE) 200 MG tablet Take 1 tablet (200 mg total) by mouth 2 (two) times daily. 60 tablet 0  . Prenatal Vit-Fe Fumarate-FA (PRENATAL MULTIVITAMIN) TABS tablet Take 1 tablet by mouth daily at 12 noon.     No current  facility-administered medications on file prior to visit.      Indications for ASA therapy (per uptodate) One of the following: Previous pregnancy with preeclampsia, especially early onset and with an adverse outcome No Multifetal gestation No Chronic hypertension Yes Type 1 or 2 diabetes mellitus No Chronic kidney disease No Autoimmune disease (antiphospholipid syndrome, systemic lupus erythematosus) No  Indications for early 1 hour GTT (per uptodate)  BMI >25 (>23 in Asian women) AND one of the following  Gestational diabetes mellitus in a previous pregnancy No Glycated hemoglobin ?5.7 percent (39 mmol/mol), impaired glucose tolerance, or impaired fasting glucose on previous testing No First-degree relative with diabetes No High-risk race/ethnicity (eg, African American, Latino, Native American, [place], Pacific Islander) Yes History of cardiovascular disease No Hypertension or on therapy for hypertension Yes High-density lipoprotein cholesterol level <35 mg/dL (0.90 mmol/L) and/or a triglyceride level >250 mg/dL (2.82 mmol/L) No Polycystic ovary syndrome No Physical inactivity No Other clinical condition associated with insulin resistance (eg, severe obesity, acanthosis nigricans) No Previous birth of an infant weighing ?4000 g No Previous stillbirth of unknown cause No Exam   Vitals:   08/31/19 1005  BP: (!) 153/92  Pulse: 93  Weight: 210 lb (95.3 kg)  Fetal Heart Rate (bpm): 155  Uterus:       Pelvic Exam: Perineum: no hemorrhoids, normal perineum   Vulva: normal external genitalia, no lesions   Vagina:  normal mucosa, normal discharge   Cervix: no lesions and normal, pap smear done.    Adnexa: normal adnexa and no mass, fullness, tenderness   Bony Pelvis: average  System: General: well-developed, well-nourished female in no acute distress   Breast:  normal appearance, no masses or tenderness   Skin: normal coloration and turgor, no rashes   Neurologic:  oriented, normal, negative, normal mood   Extremities: normal strength, tone, and muscle mass, ROM of all joints is normal   HEENT PERRLA, extraocular movement intact and sclera clear, anicteric   Mouth/Teeth mucous membranes moist, pharynx normal without lesions and dental hygiene good   Neck supple and no masses   Cardiovascular: regular rate and rhythm   Respiratory:  no respiratory distress, normal breath sounds   Abdomen: soft, non-tender; bowel sounds normal; no masses,  no organomegaly     Assessment:   Pregnancy: G2P1001 Patient Active Problem List   Diagnosis Date Noted  . Chronic hypertension affecting pregnancy 08/31/2019  . Supervision of high risk pregnancy, antepartum 08/18/2019     Plan:   1. Supervision of high risk pregnancy, antepartum --Anticipatory guidance about next visits/weeks of pregnancy given.  - Enroll Patient in Babyscripts - Genetic Screening - Cytology - PAP( [place]) - Cervicovaginal ancillary only( [place]) - Urine Culture-OB - Obstetric panel  2. Chronic hypertension complicating or reason for care during pregnancy, first trimester --Baseline labs drawn today, including PEC labs and A1C --Start Baby ASA - Comp Met (CMET) - Protein / creatinine ratio, urine - [place] MFM OB DETAIL +14 WK; Future - aspirin EC 81 MG tablet; Take 1 tablet (81 mg total) by mouth daily.  Dispense: 30 tablet; Refill: 10  3. Constipation during pregnancy in first trimester --Increase PO fluids and fiber - docusate sodium (COLACE) 100 MG capsule; Take 1 capsule (100 mg total) by mouth 2 (two) times daily as needed.  Dispense: 30 capsule; Refill: 2 - polyethylene glycol powder (GLYCOLAX/MIRALAX) 17 GM/SCOOP powder; Take 17 g by mouth daily.  Dispense: 255 g; Refill: 0  4. Family history of breast cancer --Pt mother diagnosed and treated, doing well now. Pt had baseline mammogram last year which was normal.  Start annual mammograms at age 9.   81. History of  cesarean delivery affecting pregnancy --Pt reports she was in labor but was scared so she told the doctor she wanted a cesarean.  LTCS per pt at term without complication. --May desires TOLAC, discussed today, pt undecided.  Will need MD consent at later visit.  Initial labs drawn. Continue prenatal vitamins. Discussed and offered genetic screening options, including Quad screen/AFP, NIPS testing, and option to decline testing. Benefits/risks/alternatives reviewed. Pt aware that anatomy US is form of genetic screening with lower accuracy in detecting trisomies than blood work.  Pt chooses genetic screening today. NIPS: ordered. Ultrasound discussed; fetal anatomic survey: ordered. Problem list reviewed and updated. The nature of [place] with multiple MDs and other Advanced Practice Providers was explained to patient; also emphasized that residents, students are part of our team. Routine obstetric precautions reviewed. Return in about 4 weeks (around 09/28/2019).   Fatima Blank, CNM 08/31/19 11:37 AM

## 2019-09-01 DIAGNOSIS — R7309 Other abnormal glucose: Secondary | ICD-10-CM | POA: Insufficient documentation

## 2019-09-01 LAB — COMPREHENSIVE METABOLIC PANEL
ALT: 14 IU/L (ref 0–32)
AST: 14 IU/L (ref 0–40)
Albumin/Globulin Ratio: 1.1 — ABNORMAL LOW (ref 1.2–2.2)
Albumin: 3.9 g/dL (ref 3.8–4.8)
Alkaline Phosphatase: 65 IU/L (ref 39–117)
BUN/Creatinine Ratio: 8 — ABNORMAL LOW (ref 9–23)
BUN: 6 mg/dL (ref 6–20)
Bilirubin Total: <0.2 mg/dL (ref 0.0–1.2)
CO2: 20 mmol/L (ref 20–29)
Calcium: 9.5 mg/dL (ref 8.7–10.2)
Chloride: 103 mmol/L (ref 96–106)
Creatinine, Ser: 0.73 mg/dL (ref 0.57–1.00)
GFR calc Af Amer: 123 mL/min/{1.73_m2} (ref >59)
GFR calc non Af Amer: 106 mL/min/{1.73_m2} (ref >59)
Globulin, Total: 3.4 g/dL (ref 1.5–4.5)
Glucose: 110 mg/dL — ABNORMAL HIGH (ref 65–99)
Potassium: 3.9 mmol/L (ref 3.5–5.2)
Sodium: 137 mmol/L (ref 134–144)
Total Protein: 7.3 g/dL (ref 6.0–8.5)

## 2019-09-01 LAB — OBSTETRIC PANEL, INCLUDING HIV
Antibody Screen: NEGATIVE
Basophils Absolute: 0.1 10*3/uL (ref 0.0–0.2)
Basos: 1 %
EOS (ABSOLUTE): 0.1 10*3/uL (ref 0.0–0.4)
Eos: 1 %
HIV Screen 4th Generation wRfx: NONREACTIVE
Hematocrit: 37.3 % (ref 34.0–46.6)
Hemoglobin: 12 g/dL (ref 11.1–15.9)
Hepatitis B Surface Ag: NEGATIVE
Immature Grans (Abs): 0 10*3/uL (ref 0.0–0.1)
Immature Granulocytes: 0 %
Lymphocytes Absolute: 1.8 10*3/uL (ref 0.7–3.1)
Lymphs: 18 %
MCH: 25.9 pg — ABNORMAL LOW (ref 26.6–33.0)
MCHC: 32.2 g/dL (ref 31.5–35.7)
MCV: 81 fL (ref 79–97)
Monocytes Absolute: 0.6 10*3/uL (ref 0.1–0.9)
Monocytes: 6 %
Neutrophils Absolute: 7.6 10*3/uL — ABNORMAL HIGH (ref 1.4–7.0)
Neutrophils: 74 %
Platelets: 357 10*3/uL (ref 150–450)
RBC: 4.63 x10E6/uL (ref 3.77–5.28)
RDW: 16.8 % — ABNORMAL HIGH (ref 11.7–15.4)
RPR Ser Ql: NONREACTIVE
Rh Factor: POSITIVE
Rubella Antibodies, IGG: 6.99 index (ref >0.99)
WBC: 10.1 10*3/uL (ref 3.4–10.8)

## 2019-09-01 LAB — CERVICOVAGINAL ANCILLARY ONLY
Bacterial Vaginitis (gardnerella): NEGATIVE
Candida Glabrata: NEGATIVE
Candida Vaginitis: POSITIVE — AB
Chlamydia: NEGATIVE
Comment: NEGATIVE
Comment: NEGATIVE
Comment: NEGATIVE
Comment: NEGATIVE
Comment: NEGATIVE
Comment: NORMAL
Neisseria Gonorrhea: NEGATIVE
Trichomonas: NEGATIVE

## 2019-09-01 LAB — PROTEIN / CREATININE RATIO, URINE
Creatinine, Urine: 133.6 mg/dL
Protein, Ur: 12.8 mg/dL
Protein/Creat Ratio: 96 mg/g creat (ref 0–200)

## 2019-09-01 LAB — HEMOGLOBIN A1C
Est. average glucose Bld gHb Est-mCnc: 123 mg/dL
Hgb A1c MFr Bld: 5.9 % — ABNORMAL HIGH (ref 4.8–5.6)

## 2019-09-02 LAB — URINE CULTURE, OB REFLEX

## 2019-09-02 LAB — CYTOLOGY - PAP
Comment: NEGATIVE
Diagnosis: NEGATIVE
High risk HPV: NEGATIVE

## 2019-09-02 LAB — CULTURE, OB URINE

## 2019-09-07 ENCOUNTER — Other Ambulatory Visit: Payer: Self-pay

## 2019-09-07 ENCOUNTER — Other Ambulatory Visit: Payer: Medicaid Other

## 2019-09-16 ENCOUNTER — Telehealth: Payer: Self-pay

## 2019-09-16 NOTE — Telephone Encounter (Signed)
Babyscripts called to advise [place] that the patient's BP was 137/112. I called the patient who reports that she has been sleeping most of the day and just woke up and took her Labetelol about 20 minutes ago. The pt reports having a headache. I advised pt to recheck BP, it was 162/146. I advised pt to go to the hospital to be evaluated. Pt reports she would rather wait and see if her medication starts working and recheck her BP, she states if it is still high she will go. I advised pt that she should go now and not wait. Pt verbalizes understanding.

## 2019-09-17 ENCOUNTER — Other Ambulatory Visit: Payer: Self-pay | Admitting: Obstetrics and Gynecology

## 2019-09-17 ENCOUNTER — Encounter: Payer: Self-pay | Admitting: Obstetrics and Gynecology

## 2019-09-17 DIAGNOSIS — O0990 Supervision of high risk pregnancy, unspecified, unspecified trimester: Secondary | ICD-10-CM

## 2019-09-18 ENCOUNTER — Encounter: Payer: Self-pay | Admitting: Obstetrics and Gynecology

## 2019-09-28 ENCOUNTER — Encounter: Payer: Self-pay | Admitting: Obstetrics and Gynecology

## 2019-09-28 ENCOUNTER — Ambulatory Visit (INDEPENDENT_AMBULATORY_CARE_PROVIDER_SITE_OTHER): Payer: Medicaid Other | Admitting: Obstetrics and Gynecology

## 2019-09-28 ENCOUNTER — Other Ambulatory Visit: Payer: Self-pay

## 2019-09-28 VITALS — BP 122/79 | HR 99 | Wt 218.1 lb

## 2019-09-28 DIAGNOSIS — O10919 Unspecified pre-existing hypertension complicating pregnancy, unspecified trimester: Secondary | ICD-10-CM

## 2019-09-28 DIAGNOSIS — O34219 Maternal care for unspecified type scar from previous cesarean delivery: Secondary | ICD-10-CM

## 2019-09-28 DIAGNOSIS — R7309 Other abnormal glucose: Secondary | ICD-10-CM

## 2019-09-28 DIAGNOSIS — O0990 Supervision of high risk pregnancy, unspecified, unspecified trimester: Secondary | ICD-10-CM

## 2019-09-28 DIAGNOSIS — O0992 Supervision of high risk pregnancy, unspecified, second trimester: Secondary | ICD-10-CM

## 2019-09-28 DIAGNOSIS — O10912 Unspecified pre-existing hypertension complicating pregnancy, second trimester: Secondary | ICD-10-CM

## 2019-09-28 DIAGNOSIS — Z3A16 16 weeks gestation of pregnancy: Secondary | ICD-10-CM

## 2019-09-28 NOTE — Progress Notes (Signed)
   PRENATAL VISIT NOTE  Subjective:  Kristin Lucas is a 38 y.o. G2P1001 at [redacted]w[redacted]d being seen today for ongoing prenatal care.  She is currently monitored for the following issues for this high-risk pregnancy and has Supervision of high risk pregnancy, antepartum; Chronic hypertension affecting pregnancy; History of cesarean delivery affecting pregnancy; Family history of breast cancer; and Elevated hemoglobin A1c on their problem list.  Patient reports no complaints.  Contractions: Not present. Vag. Bleeding: None.  Movement: Present. Denies leaking of fluid.   The following portions of the patient's history were reviewed and updated as appropriate: allergies, current medications, past family history, past medical history, past social history, past surgical history and problem list.   Objective:   Vitals:   09/28/19 1122  BP: 122/79  Pulse: 99  Weight: 218 lb 1.6 oz (98.9 kg)    Fetal Status: Fetal Heart Rate (bpm): 160   Movement: Present     General:  Alert, oriented and cooperative. Patient is in no acute distress.  Skin: Skin is warm and dry. No rash noted.   Cardiovascular: Normal heart rate noted  Respiratory: Normal respiratory effort, no problems with respiration noted  Abdomen: Soft, gravid, appropriate for gestational age.  Pain/Pressure: Absent     Pelvic: Cervical exam deferred        Extremities: Normal range of motion.  Edema: None  Mental Status: Normal mood and affect. Normal behavior. Normal judgment and thought content.   Assessment and Plan:  Pregnancy: G2P1001 at [redacted]w[redacted]d 1. Supervision of high risk pregnancy, antepartum Patient is doing well without complaints Patient declined AFP  Patient scheduled for anatomy ultrasound on 10/13/19  2. Chronic hypertension affecting pregnancy Continue labetalol and ASA  3. Elevated hemoglobin A1c A1c 5.9 on initial labs. Discussed returning for early glucola prior to her next appointment. Patient understands that if normal,  she will need to repeat at 26-28 weeks  4. History of cesarean delivery affecting pregnancy Undecided on TOLAC vs RCS  Preterm labor symptoms and general obstetric precautions including but not limited to vaginal bleeding, contractions, leaking of fluid and fetal movement were reviewed in detail with the patient. Please refer to After Visit Summary for other counseling recommendations.   Return in about 4 weeks (around 10/26/2019) for Virtual, ROB, High risk.  Future Appointments  Date Time Provider [place]  10/13/2019  1:00 PM [place] [facility]-US  10/13/2019  1:00 PM [place] 3 WH-MFCUS MFC-US    Mora Bellman, MD

## 2019-09-28 NOTE — Progress Notes (Signed)
Pt is here for ROB, [redacted]w[redacted]d.  

## 2019-09-29 ENCOUNTER — Other Ambulatory Visit: Payer: Medicaid Other

## 2019-09-29 ENCOUNTER — Ambulatory Visit (INDEPENDENT_AMBULATORY_CARE_PROVIDER_SITE_OTHER): Payer: Medicaid Other | Admitting: Advanced Practice Midwife

## 2019-09-29 DIAGNOSIS — O10912 Unspecified pre-existing hypertension complicating pregnancy, second trimester: Secondary | ICD-10-CM

## 2019-09-29 DIAGNOSIS — O34219 Maternal care for unspecified type scar from previous cesarean delivery: Secondary | ICD-10-CM

## 2019-09-29 DIAGNOSIS — O26892 Other specified pregnancy related conditions, second trimester: Secondary | ICD-10-CM

## 2019-09-29 DIAGNOSIS — O0990 Supervision of high risk pregnancy, unspecified, unspecified trimester: Secondary | ICD-10-CM

## 2019-09-29 DIAGNOSIS — O10919 Unspecified pre-existing hypertension complicating pregnancy, unspecified trimester: Secondary | ICD-10-CM

## 2019-09-29 DIAGNOSIS — O0992 Supervision of high risk pregnancy, unspecified, second trimester: Secondary | ICD-10-CM

## 2019-09-29 DIAGNOSIS — Z3A16 16 weeks gestation of pregnancy: Secondary | ICD-10-CM

## 2019-09-29 DIAGNOSIS — R102 Pelvic and perineal pain: Secondary | ICD-10-CM

## 2019-09-29 MED ORDER — COMFORT FIT MATERNITY SUPP MED MISC: 1.0000 | Freq: Every day | 0 refills | Status: DC

## 2019-09-29 NOTE — Progress Notes (Signed)
     PRENATAL VISIT NOTE  Subjective:  Kristin Lucas is a 38 y.o. G2P1001 at [redacted]w[redacted]d being seen today for ongoing prenatal care.  She is currently monitored for the following issues for this high-risk pregnancy and has Supervision of high risk pregnancy, antepartum; Chronic hypertension affecting pregnancy; History of cesarean delivery affecting pregnancy; Family history of breast cancer; and Elevated hemoglobin A1c on their problem list.  Patient reports pelvic pain.   .  .   . Denies leaking of fluid.   The following portions of the patient's history were reviewed and updated as appropriate: allergies, current medications, past family history, past medical history, past social history, past surgical history and problem list.   Objective:  There were no vitals filed for this visit.  Fetal Status:           General:  Alert, oriented and cooperative. Patient is in no acute distress.  Skin: Skin is warm and dry. No rash noted.   Cardiovascular: Normal heart rate noted  Respiratory: Normal respiratory effort, no problems with respiration noted  Abdomen: Soft, gravid, appropriate for gestational age.        Pelvic: Cervical exam deferred        Extremities: Normal range of motion.     Mental Status: Normal mood and affect. Normal behavior. Normal judgment and thought content.   Assessment and Plan:  Pregnancy: G2P1001 at [redacted]w[redacted]d 1. Supervision of high risk pregnancy, antepartum --Anticipatory guidance about next visits/weeks of pregnancy given.  - Glucose Tolerance, 2 Hours w/1 Hour --Lab drew fasting glucose but pt difficult stick and unable to obtain 1 hour.  After consulting with me, POCT glucose finger stick was performed and was elevated at 1 hour and at 2 hours.   --Will wait for fasting result but pt likely GDM and will need referral. --Keep next appt scheduled 10/25/18  2. Pelvic pain affecting pregnancy in second trimester, antepartum --Pt reported pain to lab so I evaluated her  and saw her as a prenatal visit --She reports lower abdominal pressure, especially when standing.  --Denies intermittent cramping/contractions, bleeding, or leaking fluid --Will do urine culture  --Rest/ice/heat/warm bath/Tylenol/pregnancy support belt  - Elastic Bandages & Supports (COMFORT FIT MATERNITY SUPP MED) MISC; 1 Device by Does not apply route daily.  Dispense: 1 each; Refill: 0  Preterm labor symptoms and general obstetric precautions including but not limited to vaginal bleeding, contractions, leaking of fluid and fetal movement were reviewed in detail with the patient. Please refer to After Visit Summary for other counseling recommendations.   No follow-ups on file.  Future Appointments  Date Time Provider [place]  10/13/2019  1:00 PM [place] [facility]-US  10/13/2019  1:00 PM [place] 3 WH-MFCUS MFC-US  10/26/2019  1:45 PM [place], Vickii Chafe, MD [place] None    Fatima Blank, CNM

## 2019-09-29 NOTE — Progress Notes (Deleted)
duplicate

## 2019-09-29 NOTE — Progress Notes (Signed)
Unable to get 1 hr and 2nd hr for glucose test. Was able to get fasting. Had to do finger sticks for 1 hr and 2nd hr.  1st hr: 10:23 am - 206 results 2nd hr: 10:57am - 190 results

## 2019-09-30 ENCOUNTER — Other Ambulatory Visit: Payer: Self-pay | Admitting: Obstetrics and Gynecology

## 2019-09-30 ENCOUNTER — Encounter: Payer: Self-pay | Admitting: Obstetrics and Gynecology

## 2019-09-30 DIAGNOSIS — O24419 Gestational diabetes mellitus in pregnancy, unspecified control: Secondary | ICD-10-CM | POA: Insufficient documentation

## 2019-09-30 LAB — GLUCOSE TOLERANCE, 2 HOURS W/ 1HR: Glucose, Fasting: 109 mg/dL — ABNORMAL HIGH (ref 65–91)

## 2019-10-02 LAB — CULTURE, OB URINE

## 2019-10-02 LAB — URINE CULTURE, OB REFLEX

## 2019-10-13 ENCOUNTER — Ambulatory Visit (HOSPITAL_COMMUNITY)
Admission: RE | Admit: 2019-10-13 | Discharge: 2019-10-13 | Disposition: A | Discharge status: Home / Self Care | Payer: Medicaid Other | Source: Ambulatory Visit | Attending: Obstetrics and Gynecology | Admitting: Obstetrics and Gynecology

## 2019-10-13 ENCOUNTER — Other Ambulatory Visit (HOSPITAL_COMMUNITY): Payer: Self-pay | Admitting: *Deleted

## 2019-10-13 ENCOUNTER — Ambulatory Visit (HOSPITAL_COMMUNITY): Payer: Medicaid Other | Admitting: *Deleted

## 2019-10-13 ENCOUNTER — Other Ambulatory Visit: Payer: Self-pay

## 2019-10-13 ENCOUNTER — Encounter (HOSPITAL_COMMUNITY): Payer: Self-pay | Admitting: *Deleted

## 2019-10-13 DIAGNOSIS — O10911 Unspecified pre-existing hypertension complicating pregnancy, first trimester: Secondary | ICD-10-CM | POA: Diagnosis present

## 2019-10-13 DIAGNOSIS — O10012 Pre-existing essential hypertension complicating pregnancy, second trimester: Secondary | ICD-10-CM | POA: Diagnosis not present

## 2019-10-13 DIAGNOSIS — O0990 Supervision of high risk pregnancy, unspecified, unspecified trimester: Secondary | ICD-10-CM | POA: Insufficient documentation

## 2019-10-13 DIAGNOSIS — O99212 Obesity complicating pregnancy, second trimester: Secondary | ICD-10-CM

## 2019-10-13 DIAGNOSIS — O24419 Gestational diabetes mellitus in pregnancy, unspecified control: Secondary | ICD-10-CM | POA: Diagnosis present

## 2019-10-13 DIAGNOSIS — O09522 Supervision of elderly multigravida, second trimester: Secondary | ICD-10-CM

## 2019-10-13 DIAGNOSIS — O24410 Gestational diabetes mellitus in pregnancy, diet controlled: Secondary | ICD-10-CM

## 2019-10-13 DIAGNOSIS — O34219 Maternal care for unspecified type scar from previous cesarean delivery: Secondary | ICD-10-CM

## 2019-10-13 DIAGNOSIS — Z3A18 18 weeks gestation of pregnancy: Secondary | ICD-10-CM

## 2019-10-13 DIAGNOSIS — O10919 Unspecified pre-existing hypertension complicating pregnancy, unspecified trimester: Secondary | ICD-10-CM | POA: Insufficient documentation

## 2019-10-14 ENCOUNTER — Encounter: Payer: Medicaid Other | Attending: Obstetrics and Gynecology | Admitting: Registered"

## 2019-10-14 DIAGNOSIS — O24419 Gestational diabetes mellitus in pregnancy, unspecified control: Secondary | ICD-10-CM | POA: Insufficient documentation

## 2019-10-14 DIAGNOSIS — O99810 Abnormal glucose complicating pregnancy: Secondary | ICD-10-CM | POA: Insufficient documentation

## 2019-10-15 ENCOUNTER — Other Ambulatory Visit: Payer: Self-pay | Admitting: Lactation Services

## 2019-10-15 MED ORDER — GLUCOSE BLOOD VI STRP: ORAL_STRIP | 12 refills | Status: DC

## 2019-10-15 MED ORDER — ACCU-CHEK SOFTCLIX LANCETS MISC: 12 refills | Status: DC

## 2019-10-16 ENCOUNTER — Encounter: Payer: Self-pay | Admitting: Registered"

## 2019-10-16 NOTE — Progress Notes (Signed)
Patient was seen on 10/14/19 for Gestational Diabetes self-management class at [facility]. The following learning objectives were met by the patient during this course:   States the definition of Gestational Diabetes  States why dietary management is important in controlling blood glucose  Describes the effects each nutrient has on blood glucose levels  Demonstrates ability to create a balanced meal plan  Demonstrates carbohydrate counting   States when to check blood glucose levels  Demonstrates proper blood glucose monitoring techniques  States the effect of stress and exercise on blood glucose levels  States the importance of limiting caffeine and abstaining from alcohol and smoking  Blood glucose monitor given: Accu-chek Guide Me Lot # [id number] Exp: 10/27/20 Blood glucose reading: 89 mg/dL  Patient instructed to monitor glucose levels: FBS: 60 - <95; 1 hour: <140; 2 hour: <120  Patient received handouts:  Nutrition Diabetes and Pregnancy, including carb counting list  Patient will be seen for follow-up as needed.

## 2019-10-26 ENCOUNTER — Telehealth (INDEPENDENT_AMBULATORY_CARE_PROVIDER_SITE_OTHER): Payer: Medicaid Other | Admitting: Obstetrics and Gynecology

## 2019-10-26 ENCOUNTER — Encounter: Payer: Self-pay | Admitting: Obstetrics and Gynecology

## 2019-10-26 VITALS — BP 130/75 | HR 90

## 2019-10-26 DIAGNOSIS — O0992 Supervision of high risk pregnancy, unspecified, second trimester: Secondary | ICD-10-CM

## 2019-10-26 DIAGNOSIS — O34219 Maternal care for unspecified type scar from previous cesarean delivery: Secondary | ICD-10-CM

## 2019-10-26 DIAGNOSIS — O0990 Supervision of high risk pregnancy, unspecified, unspecified trimester: Secondary | ICD-10-CM

## 2019-10-26 DIAGNOSIS — O10919 Unspecified pre-existing hypertension complicating pregnancy, unspecified trimester: Secondary | ICD-10-CM

## 2019-10-26 DIAGNOSIS — Z3A20 20 weeks gestation of pregnancy: Secondary | ICD-10-CM

## 2019-10-26 DIAGNOSIS — O24419 Gestational diabetes mellitus in pregnancy, unspecified control: Secondary | ICD-10-CM

## 2019-10-26 DIAGNOSIS — O10912 Unspecified pre-existing hypertension complicating pregnancy, second trimester: Secondary | ICD-10-CM

## 2019-10-26 NOTE — Progress Notes (Signed)
ROB   CC: Vaginal Pressure.  Pt states sugars have been up and down

## 2019-10-26 NOTE — Progress Notes (Signed)
TELEHEALTH OBSTETRICS PRENATAL VIRTUAL VIDEO VISIT ENCOUNTER NOTE  Provider location: [facility] [place]   I connected with Kristin Lucas on 10/26/19 at  1:45 PM EST by MyChart Video Encounter at home and verified that I am speaking with the correct person using two identifiers.   I discussed the limitations, risks, security and privacy concerns of performing an evaluation and management service virtually and the availability of in person appointments. I also discussed with the patient that there may be a patient responsible charge related to this service. The patient expressed understanding and agreed to proceed. Subjective:  Kristin Lucas is a 39 y.o. G2P1001 at [redacted]w[redacted]d being seen today for ongoing prenatal care.  She is currently monitored for the following issues for this high-risk pregnancy and has Supervision of high risk pregnancy, antepartum; Chronic hypertension affecting pregnancy; History of cesarean delivery affecting pregnancy; Family history of breast cancer; Elevated hemoglobin A1c; Gestational diabetes mellitus (GDM) affecting pregnancy, antepartum; and Abnormal glucose tolerance test (GTT) during pregnancy, antepartum on their problem list.  Patient reports round ligament pains.  Contractions: Not present. Vag. Bleeding: None.  Movement: Present. Denies any leaking of fluid.   The following portions of the patient's history were reviewed and updated as appropriate: allergies, current medications, past family history, past medical history, past social history, past surgical history and problem list.   Objective:   Vitals:   10/26/19 1335  BP: 130/75  Pulse: 90    Fetal Status:     Movement: Present     General:  Alert, oriented and cooperative. Patient is in no acute distress.  Respiratory: Normal respiratory effort, no problems with respiration noted  Mental Status: Normal mood and affect. Normal behavior. Normal judgment and thought content.  Rest of  physical exam deferred due to type of encounter  Imaging: [place] MFM OB DETAIL +14 WK  Result Date: 10/13/2019 ----------------------------------------------------------------------  OBSTETRICS REPORT                       (Signed Final 10/13/2019 02:26 pm) ---------------------------------------------------------------------- Patient Info  ID #:       [id number]                          D.O.B.:  16-May-1981 (38 yrs)  Name:       Kristin Lucas                    Visit Date: 10/13/2019 01:08 pm ---------------------------------------------------------------------- Performed By  Performed By:     Percell Boston          Ref. Address:     [address]  [address]                                                             [place]                                                             [phone]  Attending:        Lin Landsmanorenthian Booker      [facility]  Referred By:      [facility] ---------------------------------------------------------------------- Orders   #  Description                          Code         Ordered By   1  US MFM OB DETAIL +14 WK              76811.01     LISA LEFTWICH-                                                        KIRBY  ----------------------------------------------------------------------   #  Order #                    Accession #                 Episode #   1  [id number]                  [id number]  ---------------------------------------------------------------------- Indications   [redacted] weeks gestation of pregnancy                Z3A.18   Encounter for antenatal screening for          Z36.3   malformations   Advanced maternal age multigravida 235+,        34O09.522   second trimester (low risk NIPS, 3.2FF)   Hypertension -  Chronic/Pre-existing            O10.019   (labetalol)   Obesity complicating pregnancy, second         O99.212   trimester   Previous cesarean delivery, antepartum x 1     O34.219   Gestational diabetes in pregnancy, diet        O24.410   controlled  ---------------------------------------------------------------------- Fetal Evaluation  Num Of Fetuses:         1  Fetal Heart Rate(bpm):  150  Cardiac Activity:       Observed  Presentation:           Cephalic  Placenta:               Posterior  P. Cord Insertion:      Visualized, central  Amniotic Fluid  AFI FV:      Within normal limits                              Largest Pocket(cm)                              3.7 ---------------------------------------------------------------------- Biometry  BPD:      40.4  mm     G. Age:  18w 2d         25  %    CI:        78.43   %    70 - 86                                                          FL/HC:      18.5   %    16.1 - 18.3  HC:      144.3  mm     G. Age:  17w 5d          4  %    HC/AC:      1.08        1.09 - 1.39  AC:      133.8  mm     G. Age:  18w 6d         46  %    FL/BPD:     66.1   %  FL:       26.7  mm     G. Age:  18w 1d         19  %    FL/AC:      20.0   %    20 - 24  HUM:        26  mm     G. Age:  18w 1d         32  %  CER:      16.9  mm     G. Age:  16w 6d          6  %  NFT:         3  mm  CM:        3.9  mm  Est. FW:     239  gm      0 lb 8 oz     22  % ---------------------------------------------------------------------- OB History  Gravidity:    2         Term:   1        Prem:   0        SAB:   0  TOP:          0       Ectopic:  0        Living: 1 ---------------------------------------------------------------------- Gestational Age  LMP:           18w 6d  Date:  06/03/19                 EDD:   03/09/20  U/S Today:     18w 2d                                        EDD:   03/13/20  Best:          18w 6d     Det. By:  LMP  (06/03/19)          EDD:   03/09/20  ---------------------------------------------------------------------- Anatomy  Cranium:               Appears normal         LVOT:                   Appears normal  Cavum:                 Appears normal         Aortic Arch:            Not well visualized  Ventricles:            Appears normal         Ductal Arch:            Appears normal  Choroid Plexus:        Appears normal         Diaphragm:              Appears normal  Cerebellum:            Appears normal         Stomach:                Appears normal, left                                                                        sided  Posterior Fossa:       Appears normal         Abdomen:                Appears normal  Nuchal Fold:           Appears normal         Abdominal Wall:         Appears nml (cord                                                                        insert, abd wall)  Face:                  Appears normal         Cord Vessels:           Appears normal (3                         (  orbits and profile)                           vessel cord)  Lips:                  Appears normal         Kidneys:                Appear normal  Palate:                Not well visualized    Bladder:                Appears normal  Thoracic:              Appears normal         Spine:                  Not well visualized  Heart:                 Appears normal         Upper Extremities:      Appears normal                         (4CH, axis, and                         situs)  RVOT:                  Appears normal         Lower Extremities:      Appears normal  Other:  Nasal bone visualized. Heels and 5th digit visualized. 3VV visualized. ---------------------------------------------------------------------- Cervix Uterus Adnexa  Cervix  Normal appearance by transabdominal scan.  Uterus  No abnormality visualized.  Left Ovary  No adnexal mass visualized.  Right Ovary  No adnexal mass visualized.  Cul De Sac  No free fluid seen.  Adnexa  No abnormality visualized.  ---------------------------------------------------------------------- Impression  Single intrauterine pregnancy with normal anatomy today.  Suboptimal views of the fetal anatomy was obtained  secondary to fetal position.  Ms. Kirkland has chronic hypertension on medical therapy and  new diagnosed GDM managed with diet, given a fasting of  109 mg/dL. She had a HbA1c of 5.9%.  She has a low risk NIPS result. ---------------------------------------------------------------------- Recommendations  Follow up anatomy in 4 weeks. ----------------------------------------------------------------------               Sander Nephew, MD Electronically Signed Final Report   10/13/2019 02:26 pm ----------------------------------------------------------------------   Assessment and Plan:  Pregnancy: G2P1001 at [redacted]w[redacted]d 1. Supervision of high risk pregnancy, antepartum Patient is doing well Reassurance provided on round ligament pain Patient advised to pick up maternity belt previously presecribed Follow up anatomy ultrasound in february  2. Gestational diabetes mellitus (GDM) affecting pregnancy, antepartum CBGs reviewed and all fasting elevated (103-123). Post prandial are within range Reviewed diet with the patient and advised to consume a protein rich snack at bedtime Discussed starting metformin qHS is fasting readings remain elevated. RTC virtually in 2 weeks to review CBGs  3. Chronic hypertension affecting pregnancy Continue labetalol and ASA  4. History of cesarean delivery affecting pregnancy Will discuss TOLAC at a later visit  Preterm labor symptoms and general obstetric precautions including but not limited to vaginal bleeding, contractions, leaking of fluid and fetal movement were reviewed in detail  with the patient. I discussed the assessment and treatment plan with the patient. The patient was provided an opportunity to ask questions and all were answered. The patient agreed with the plan and  demonstrated an understanding of the instructions. The patient was advised to call back or seek an in-person office evaluation/[facility] for any urgent or concerning symptoms. Please refer to After Visit Summary for other counseling recommendations.   I provided 15 minutes of face-to-face time during this encounter.  No follow-ups on file.  Future Appointments  Date Time Provider Department Center  10/26/2019  1:45 PM Nuri[place] Stuck, Gigi GinPeggy, MD [facility] None  11/10/2019  1:00 PM WH-MFC US 3 WH-MFCUS MFC-US  11/10/2019  1:05 PM WH-MFC NURSE WH-MFC MFC-US    Catalina AntiguaPeggy Halo Laski, MD [facility]

## 2019-10-30 ENCOUNTER — Encounter: Payer: Self-pay | Admitting: Obstetrics and Gynecology

## 2019-10-30 NOTE — Progress Notes (Signed)
Reviewed data from Babyscripts, patient has not been logging CBGs regularly. She was seen several days ago in office and CBGs reviewed with provider. MyChart message sent asking her to log CBGs regularly in app.   K. Therese Sarah, M.D. Attending [facility])

## 2019-11-09 ENCOUNTER — Encounter: Payer: Self-pay | Admitting: Obstetrics and Gynecology

## 2019-11-09 ENCOUNTER — Telehealth (INDEPENDENT_AMBULATORY_CARE_PROVIDER_SITE_OTHER): Payer: Medicaid Other | Admitting: Obstetrics and Gynecology

## 2019-11-09 DIAGNOSIS — O24419 Gestational diabetes mellitus in pregnancy, unspecified control: Secondary | ICD-10-CM

## 2019-11-09 DIAGNOSIS — Z3A22 22 weeks gestation of pregnancy: Secondary | ICD-10-CM

## 2019-11-09 DIAGNOSIS — O10912 Unspecified pre-existing hypertension complicating pregnancy, second trimester: Secondary | ICD-10-CM

## 2019-11-09 DIAGNOSIS — O10919 Unspecified pre-existing hypertension complicating pregnancy, unspecified trimester: Secondary | ICD-10-CM

## 2019-11-09 DIAGNOSIS — O0990 Supervision of high risk pregnancy, unspecified, unspecified trimester: Secondary | ICD-10-CM

## 2019-11-09 DIAGNOSIS — O0992 Supervision of high risk pregnancy, unspecified, second trimester: Secondary | ICD-10-CM

## 2019-11-09 DIAGNOSIS — O34219 Maternal care for unspecified type scar from previous cesarean delivery: Secondary | ICD-10-CM

## 2019-11-09 MED ORDER — METFORMIN HCL 500 MG PO TABS: 500.0000 mg | ORAL_TABLET | Freq: Every day | ORAL | 5 refills | Status: DC

## 2019-11-09 NOTE — Progress Notes (Signed)
Pt is having problems with BP cuff today- reading error.  Pt states that her fasting may be elevated at times.

## 2019-11-09 NOTE — Progress Notes (Signed)
TELEHEALTH OBSTETRICS PRENATAL VIRTUAL VIDEO VISIT ENCOUNTER NOTE  Provider location: [facility] [place]   I connected with Kristin Lucas on 11/09/19 at  1:45 PM EST by MyChart Video Encounter at home and verified that I am speaking with the correct person using two identifiers.   I discussed the limitations, risks, security and privacy concerns of performing an evaluation and management service virtually and the availability of in person appointments. I also discussed with the patient that there may be a patient responsible charge related to this service. The patient expressed understanding and agreed to proceed. Subjective:  Kristin Lucas is a 39 y.o. G2P1001 at [redacted]w[redacted]d being seen today for ongoing prenatal care.  She is currently monitored for the following issues for this high-risk pregnancy and has Supervision of high risk pregnancy, antepartum; Chronic hypertension affecting pregnancy; History of cesarean delivery affecting pregnancy; Family history of breast cancer; Elevated hemoglobin A1c; Gestational diabetes mellitus (GDM) affecting pregnancy, antepartum; and Abnormal glucose tolerance test (GTT) during pregnancy, antepartum on their problem list.  Patient reports no complaints.  Contractions: Not present. Vag. Bleeding: None.  Movement: Present. Denies any leaking of fluid.   The following portions of the patient's history were reviewed and updated as appropriate: allergies, current medications, past family history, past medical history, past social history, past surgical history and problem list.   Objective:  There were no vitals filed for this visit.  Fetal Status:     Movement: Present     General:  Alert, oriented and cooperative. Patient is in no acute distress.  Respiratory: Normal respiratory effort, no problems with respiration noted  Mental Status: Normal mood and affect. Normal behavior. Normal judgment and thought content.  Rest of physical exam  deferred due to type of encounter  Imaging: [place] MFM OB DETAIL +14 WK  Result Date: 10/13/2019 ----------------------------------------------------------------------  OBSTETRICS REPORT                       (Signed Final 10/13/2019 02:26 pm) ---------------------------------------------------------------------- Patient Info  ID #:       [id number]                          D.O.B.:  Jun 18, 1981 (38 yrs)  Name:       Kristin Lucas                    Visit Date: 10/13/2019 01:08 pm ---------------------------------------------------------------------- Performed By  Performed By:     Percell Boston          Ref. Address:     [address]                                                             [phone]  [place]                                                             [address]  Attending:        Lin Landsman      [facility]                                       [facility]  Referred By:      [facility] ---------------------------------------------------------------------- Orders   #  Description                          Code         Ordered By   1  [place] MFM OB DETAIL +14 WK              76811.01     LISA LEFTWICH-                                                        KIRBY  ----------------------------------------------------------------------   #  Order #                    Accession #                 Episode #   1  [id number]                  [id number]                  [id number]  ---------------------------------------------------------------------- Indications   [redacted] weeks gestation of pregnancy                Z3A.18   Encounter for antenatal screening for          Z36.3   malformations   Advanced maternal age multigravida 33+,        O65.522   second trimester (low risk NIPS, 3.2FF)   Hypertension -  Chronic/Pre-existing            O10.019   (labetalol)   Obesity complicating pregnancy, second         O99.212   trimester   Previous cesarean delivery, antepartum x 1     O34.219   Gestational diabetes in pregnancy, diet        O24.410   controlled  ---------------------------------------------------------------------- Fetal Evaluation  Num Of Fetuses:         1  Fetal Heart Rate(bpm):  150  Cardiac Activity:       Observed  Presentation:           Cephalic  Placenta:               Posterior  P. Cord Insertion:      Visualized, central  Amniotic Fluid  AFI FV:  Within normal limits                              Largest Pocket(cm)                              3.7 ---------------------------------------------------------------------- Biometry  BPD:      40.4  mm     G. Age:  18w 2d         25  %    CI:        78.43   %    70 - 86                                                          FL/HC:      18.5   %    16.1 - 18.3  HC:      144.3  mm     G. Age:  17w 5d          4  %    HC/AC:      1.08        1.09 - 1.39  AC:      133.8  mm     G. Age:  18w 6d         46  %    FL/BPD:     66.1   %  FL:       26.7  mm     G. Age:  18w 1d         19  %    FL/AC:      20.0   %    20 - 24  HUM:        26  mm     G. Age:  18w 1d         32  %  CER:      16.9  mm     G. Age:  16w 6d          6  %  NFT:         3  mm  CM:        3.9  mm  Est. FW:     239  gm      0 lb 8 oz     22  % ---------------------------------------------------------------------- OB History  Gravidity:    2         Term:   1        Prem:   0        SAB:   0  TOP:          0       Ectopic:  0        Living: 1 ---------------------------------------------------------------------- Gestational Age  LMP:           18w 6d        Date:  06/03/19                 EDD:   03/09/20  U/S Today:     18w 2d  EDD:   03/13/20  Best:          18w 6d     Det. By:  LMP  (06/03/19)          EDD:   03/09/20  ---------------------------------------------------------------------- Anatomy  Cranium:               Appears normal         LVOT:                   Appears normal  Cavum:                 Appears normal         Aortic Arch:            Not well visualized  Ventricles:            Appears normal         Ductal Arch:            Appears normal  Choroid Plexus:        Appears normal         Diaphragm:              Appears normal  Cerebellum:            Appears normal         Stomach:                Appears normal, left                                                                        sided  Posterior Fossa:       Appears normal         Abdomen:                Appears normal  Nuchal Fold:           Appears normal         Abdominal Wall:         Appears nml (cord                                                                        insert, abd wall)  Face:                  Appears normal         Cord Vessels:           Appears normal (3                         (orbits and profile)                           vessel cord)  Lips:                  Appears normal         Kidneys:  Appear normal  Palate:                Not well visualized    Bladder:                Appears normal  Thoracic:              Appears normal         Spine:                  Not well visualized  Heart:                 Appears normal         Upper Extremities:      Appears normal                         (4CH, axis, and                         situs)  RVOT:                  Appears normal         Lower Extremities:      Appears normal  Other:  Nasal bone visualized. Heels and 5th digit visualized. 3VV visualized. ---------------------------------------------------------------------- Cervix Uterus Adnexa  Cervix  Normal appearance by transabdominal scan.  Uterus  No abnormality visualized.  Left Ovary  No adnexal mass visualized.  Right Ovary  No adnexal mass visualized.  Cul De Sac  No free fluid seen.  Adnexa  No abnormality visualized.  ---------------------------------------------------------------------- Impression  Single intrauterine pregnancy with normal anatomy today.  Suboptimal views of the fetal anatomy was obtained  secondary to fetal position.  Ms. Christy Gentlesoe has chronic hypertension on medical therapy and  new diagnosed GDM managed with diet, given a fasting of  109 mg/dL. She had a HbA1c of 5.9%.  She has a low risk NIPS result. ---------------------------------------------------------------------- Recommendations  Follow up anatomy in 4 weeks. ----------------------------------------------------------------------               Lin Landsmanorenthian Booker, MD Electronically Signed Final Report   10/13/2019 02:26 pm ----------------------------------------------------------------------   Assessment and Plan:  Pregnancy: G2P1001 at 5319w5d 1. Gestational diabetes mellitus (GDM) affecting pregnancy, antepartum Fasting CBG's remain elevated Will start Metformin qhs F/U growth scan tomorrow Desires to keep track of CBG's on paper and not use Baby Rx app - metFORMIN (GLUCOPHAGE) 500 MG tablet; Take 1 tablet (500 mg total) by mouth at bedtime.  Dispense: 30 tablet; Refill: 5  2. Chronic hypertension affecting pregnancy BP stable Continue with current management F/U growth scan tomorrow  3. Supervision of high risk pregnancy, antepartum Stable   Preterm labor symptoms and general obstetric precautions including but not limited to vaginal bleeding, contractions, leaking of fluid and fetal movement were reviewed in detail with the patient. I discussed the assessment and treatment plan with the patient. The patient was provided an opportunity to ask questions and all were answered. The patient agreed with the plan and demonstrated an understanding of the instructions. The patient was advised to call back or seek an in-person office evaluation/[facility] for any urgent or concerning symptoms. Please refer to After  Visit Summary for other counseling recommendations.   I provided 8 minutes of face-to-face time during this encounter.  No follow-ups on file.  Future Appointments  Date Time Provider Department Center  11/10/2019  1:00 PM WH-MFC [place] 3 WH-MFCUS MFC-US  11/10/2019  1:05 PM WH-MFC NURSE WH-MFC MFC-US    Hermina Staggers, MD [facility]

## 2019-11-10 ENCOUNTER — Ambulatory Visit (HOSPITAL_COMMUNITY)
Admission: RE | Admit: 2019-11-10 | Discharge: 2019-11-10 | Disposition: A | Discharge status: Home / Self Care | Payer: Medicaid Other | Source: Ambulatory Visit | Attending: Maternal & Fetal Medicine | Admitting: Maternal & Fetal Medicine

## 2019-11-10 ENCOUNTER — Other Ambulatory Visit (HOSPITAL_COMMUNITY): Payer: Self-pay | Admitting: *Deleted

## 2019-11-10 ENCOUNTER — Other Ambulatory Visit: Payer: Self-pay

## 2019-11-10 ENCOUNTER — Ambulatory Visit (HOSPITAL_COMMUNITY): Payer: Medicaid Other | Admitting: *Deleted

## 2019-11-10 ENCOUNTER — Encounter (HOSPITAL_COMMUNITY): Payer: Self-pay

## 2019-11-10 DIAGNOSIS — O0990 Supervision of high risk pregnancy, unspecified, unspecified trimester: Secondary | ICD-10-CM

## 2019-11-10 DIAGNOSIS — O10919 Unspecified pre-existing hypertension complicating pregnancy, unspecified trimester: Secondary | ICD-10-CM

## 2019-11-10 DIAGNOSIS — O24419 Gestational diabetes mellitus in pregnancy, unspecified control: Secondary | ICD-10-CM | POA: Insufficient documentation

## 2019-11-10 DIAGNOSIS — O34219 Maternal care for unspecified type scar from previous cesarean delivery: Secondary | ICD-10-CM

## 2019-11-10 DIAGNOSIS — O99611 Diseases of the digestive system complicating pregnancy, first trimester: Secondary | ICD-10-CM

## 2019-11-10 DIAGNOSIS — O09522 Supervision of elderly multigravida, second trimester: Secondary | ICD-10-CM | POA: Diagnosis present

## 2019-11-10 DIAGNOSIS — O10012 Pre-existing essential hypertension complicating pregnancy, second trimester: Secondary | ICD-10-CM

## 2019-11-10 DIAGNOSIS — O99212 Obesity complicating pregnancy, second trimester: Secondary | ICD-10-CM

## 2019-11-10 DIAGNOSIS — O24410 Gestational diabetes mellitus in pregnancy, diet controlled: Secondary | ICD-10-CM

## 2019-11-10 DIAGNOSIS — Z362 Encounter for other antenatal screening follow-up: Secondary | ICD-10-CM

## 2019-11-10 DIAGNOSIS — K5900 Constipation, unspecified: Secondary | ICD-10-CM

## 2019-11-10 DIAGNOSIS — Z3A22 22 weeks gestation of pregnancy: Secondary | ICD-10-CM

## 2019-11-10 MED ORDER — POLYETHYLENE GLYCOL 3350 17 GM/SCOOP PO POWD: 17.0000 g | Freq: Every day | ORAL | 0 refills | Status: DC

## 2019-11-23 ENCOUNTER — Telehealth (INDEPENDENT_AMBULATORY_CARE_PROVIDER_SITE_OTHER): Payer: Medicaid Other | Admitting: Obstetrics and Gynecology

## 2019-11-23 ENCOUNTER — Encounter: Payer: Self-pay | Admitting: Obstetrics and Gynecology

## 2019-11-23 VITALS — BP 125/78 | HR 104

## 2019-11-23 DIAGNOSIS — O34219 Maternal care for unspecified type scar from previous cesarean delivery: Secondary | ICD-10-CM

## 2019-11-23 DIAGNOSIS — O0990 Supervision of high risk pregnancy, unspecified, unspecified trimester: Secondary | ICD-10-CM

## 2019-11-23 DIAGNOSIS — O10919 Unspecified pre-existing hypertension complicating pregnancy, unspecified trimester: Secondary | ICD-10-CM

## 2019-11-23 DIAGNOSIS — O0992 Supervision of high risk pregnancy, unspecified, second trimester: Secondary | ICD-10-CM

## 2019-11-23 DIAGNOSIS — O10912 Unspecified pre-existing hypertension complicating pregnancy, second trimester: Secondary | ICD-10-CM

## 2019-11-23 DIAGNOSIS — Z3A24 24 weeks gestation of pregnancy: Secondary | ICD-10-CM

## 2019-11-23 DIAGNOSIS — O24419 Gestational diabetes mellitus in pregnancy, unspecified control: Secondary | ICD-10-CM

## 2019-11-23 MED ORDER — VITAFOL ULTRA 29-0.6-0.4-200 MG PO CAPS: 1.0000 | ORAL_CAPSULE | Freq: Every day | ORAL | 12 refills | Status: AC

## 2019-11-23 MED ORDER — METFORMIN HCL 1000 MG PO TABS: 1000.0000 mg | ORAL_TABLET | Freq: Every day | ORAL | 3 refills | Status: DC

## 2019-11-23 NOTE — Progress Notes (Signed)
Pt was recently started on Metformin.  Pt states she is still having issues with Fasting BS.

## 2019-11-23 NOTE — Progress Notes (Signed)
TELEHEALTH OBSTETRICS PRENATAL VIRTUAL VIDEO VISIT ENCOUNTER NOTE  Provider location: [facility] [place]   I connected with Kristin Lucas on 11/23/19 at  2:15 PM EST by MyChart Video Encounter at home and verified that I am speaking with the correct person using two identifiers.   I discussed the limitations, risks, security and privacy concerns of performing an evaluation and management service virtually and the availability of in person appointments. I also discussed with the patient that there may be a patient responsible charge related to this service. The patient expressed understanding and agreed to proceed. Subjective:  Kristin Lucas is a 39 y.o. G2P1001 at [redacted]w[redacted]d being seen today for ongoing prenatal care.  She is currently monitored for the following issues for this high-risk pregnancy and has Supervision of high risk pregnancy, antepartum; Chronic hypertension affecting pregnancy; History of cesarean delivery affecting pregnancy; Family history of breast cancer; Elevated hemoglobin A1c; Gestational diabetes mellitus (GDM) affecting pregnancy, antepartum; and Abnormal glucose tolerance test (GTT) during pregnancy, antepartum on their problem list.  Patient reports no complaints.  Contractions: Not present. Vag. Bleeding: None.  Movement: Present. Denies any leaking of fluid.   The following portions of the patient's history were reviewed and updated as appropriate: allergies, current medications, past family history, past medical history, past social history, past surgical history and problem list.   Objective:   Vitals:   11/23/19 1350  BP: 125/78  Pulse: (!) 104    Fetal Status:     Movement: Present     General:  Alert, oriented and cooperative. Patient is in no acute distress.  Respiratory: Normal respiratory effort, no problems with respiration noted  Mental Status: Normal mood and affect. Normal behavior. Normal judgment and thought content.  Rest of  physical exam deferred due to type of encounter  Imaging: [place] MFM OB FOLLOW UP  Result Date: 11/10/2019 ----------------------------------------------------------------------  OBSTETRICS REPORT                       (Signed Final 11/10/2019 09:37 pm) ---------------------------------------------------------------------- Patient Info  ID #:       [id number]                          D.O.B.:  11/10/1980 (38 yrs)  Name:       Kristin Lucas                    Visit Date: 11/10/2019 12:56 pm ---------------------------------------------------------------------- Performed By  Performed By:     Tommie Raymond BS,       Ref. Address:      [address]                                                              [address]  [address] [place]                                                              [place]  Attending:        Sander Nephew      [facility]                    MD                                        [facility]  Referred By:      [facility] ---------------------------------------------------------------------- Orders   #  Description                          Code         Ordered By   1  [place] MFM OB FOLLOW UP                  29937.16     Sander Nephew  ----------------------------------------------------------------------   #  Order #                    Accession #                 Episode #   1  [id number]                  [id number]                  [id number]  ---------------------------------------------------------------------- Indications   [redacted] weeks gestation of pregnancy                Z3A.53   Advanced maternal age multigravida 51+,        O58.522   second trimester (low risk NIPS, 3.2FF)   Hypertension - Chronic/Pre-existing            O10.019   (labetalol)   Obesity  complicating pregnancy, second         O99.212   trimester   Previous cesarean delivery, antepartum x 1     O34.219   Gestational diabetes in pregnancy, diet        O24.410   controlled   Encounter for antenatal screening for          Z36.3   malformations  ---------------------------------------------------------------------- Fetal Evaluation  Num Of Fetuses:  1  Fetal Heart Rate(bpm):   149  Cardiac Activity:        Observed  Presentation:            Cephalic  Placenta:                Posterior  P. Cord Insertion:       Previously Visualized  Amniotic Fluid  AFI FV:      Within normal limits                              Largest Pocket(cm)                              4.56 ---------------------------------------------------------------------- Biometry  BPD:      50.6  mm     G. Age:  21w 2d          5  %    CI:        74.14   %    70 - 86                                                          FL/HC:       20.6  %    19.2 - 20.8  HC:      186.6  mm     G. Age:  21w 0d        < 1  %    HC/AC:       1.04       1.05 - 1.21  AC:      179.1  mm     G. Age:  22w 5d         39  %    FL/BPD:      75.9  %    71 - 87  FL:       38.4  mm     G. Age:  22w 2d         22  %    FL/AC:       21.4  %    20 - 24  HUM:      36.6  mm     G. Age:  22w 5d         41  %  CER:      23.9  mm     G. Age:  22w 0d         36  %  LV:          6  mm  CM:        7.7  mm  Est. FW:     497   gm     1 lb 2 oz     21  % ---------------------------------------------------------------------- OB History  Gravidity:    2         Term:   1        Prem:   0        SAB:   0  TOP:          0       Ectopic:  0        Living: 1 ---------------------------------------------------------------------- Gestational  Age  LMP:           22w 6d        Date:  06/03/19                 EDD:   03/09/20  U/S Today:     21w 6d                                        EDD:   03/16/20  Best:          22w 6d     Det. By:  LMP  (06/03/19)          EDD:   03/09/20  ---------------------------------------------------------------------- Anatomy  Cranium:               Appears normal         LVOT:                   Previously seen  Cavum:                 Appears normal         Aortic Arch:            Appears normal  Ventricles:            Appears normal         Ductal Arch:            Previously seen  Choroid Plexus:        Appears normal         Diaphragm:              Appears normal  Cerebellum:            Appears normal         Stomach:                Appears normal, left                                                                        sided  Posterior Fossa:       Previously seen        Abdomen:                Previously seen  Nuchal Fold:           Previously seen        Abdominal Wall:         Appears nml (cord                                                                        insert, abd wall)  Face:                  Orbits and profile     Cord Vessels:           Appears normal (3  previously seen                                vessel cord)  Lips:                  Previously seen        Kidneys:                Appear normal  Palate:                Not well visualized    Bladder:                Appears normal  Thoracic:              Previously seen        Spine:                  Appears normal  Heart:                 Appears normal         Upper Extremities:      Previously seen                         (4CH, axis, and                         situs)  RVOT:                  Previously seen        Lower Extremities:      Previously seen  Other:  Nasal bone previously seen.Marland Kitchen Heels and 5th digit visualized          previously. 3VV visualized. ---------------------------------------------------------------------- Cervix Uterus Adnexa  Cervix  Length:           4.07  cm.  Normal appearance by transabdominal scan.  Uterus  No abnormality visualized.  Left Ovary  Within normal limits. No adnexal mass visualized.  Right Ovary  Within normal limits. No  adnexal mass visualized.  Cul De Sac  No free fluid seen.  Adnexa  No abnormality visualized. ---------------------------------------------------------------------- Impression  Normal interval growth  Advanced maternal age  A1GDM  Chronic hypertension on medication.  Good fetal movement and amniotic fluid. ---------------------------------------------------------------------- Recommendations  Follow up growth in 4-6 weeks given chronic hypertension  Initiate testing at 32 weeks ----------------------------------------------------------------------               Lin Landsman, MD Electronically Signed Final Report   11/10/2019 09:37 pm ----------------------------------------------------------------------   Assessment and Plan:  Pregnancy: G2P1001 at [redacted]w[redacted]d 1. Supervision of high risk pregnancy, antepartum Patient is doing well without complaints Third trimester labs without glucola next visit  2. Chronic hypertension affecting pregnancy Continue labetalol 200 BID Continue ASA Follow up growth [place] 3/2 Antenatal testing at 32 weeks  3. Gestational diabetes mellitus (GDM) affecting pregnancy, antepartum CBGs reviewed and fasting low 100's with highest being 107; pp values well controlled with post values in 110's and highest value of 132 Will increase to metformin 1000 qHS. Discussed possibility of changing to insulin if CBGs not well controlled and patient honestly responded that she may not be able to self administer. RTC in 2 weeks in person for labs and to review CBG  4. History of cesarean delivery affecting pregnancy Will discuss mode of delivery next visit  Preterm labor symptoms and general obstetric precautions including but not limited to vaginal bleeding, contractions, leaking of fluid and fetal movement were reviewed in detail with the patient. I discussed the assessment and treatment plan with the patient. The patient was provided an opportunity to ask questions and all were  answered. The patient agreed with the plan and demonstrated an understanding of the instructions. The patient was advised to call back or seek an in-person office evaluation/go to [facility]U at [facility] for any urgent or concerning symptoms. Please refer to After Visit Summary for other counseling recommendations.   I provided 15 minutes of face-to-face time during this encounter.  No follow-ups on file.  Future Appointments  Date Time Provider Department Center  11/23/2019  2:15 PM Lemon Whitacre, Gigi Gin, MD [facility] None  12/08/2019  1:30 PM WH-MFC NURSE WH-MFC MFC-US  12/08/2019  1:30 PM WH-MFC [place] 1 WH-MFCUS MFC-US    Catalina Antigua, MD [facility], [facility]

## 2019-12-07 ENCOUNTER — Encounter: Payer: Medicaid Other | Admitting: Obstetrics and Gynecology

## 2019-12-08 ENCOUNTER — Other Ambulatory Visit: Payer: Self-pay

## 2019-12-08 ENCOUNTER — Other Ambulatory Visit (HOSPITAL_COMMUNITY): Payer: Self-pay | Admitting: *Deleted

## 2019-12-08 ENCOUNTER — Ambulatory Visit (HOSPITAL_COMMUNITY)
Admission: RE | Admit: 2019-12-08 | Discharge: 2019-12-08 | Disposition: A | Discharge status: Home / Self Care | Payer: Medicaid Other | Source: Ambulatory Visit | Attending: Obstetrics and Gynecology | Admitting: Obstetrics and Gynecology

## 2019-12-08 ENCOUNTER — Encounter (HOSPITAL_COMMUNITY): Payer: Self-pay | Admitting: *Deleted

## 2019-12-08 ENCOUNTER — Ambulatory Visit (HOSPITAL_COMMUNITY): Payer: Medicaid Other | Admitting: *Deleted

## 2019-12-08 DIAGNOSIS — Z362 Encounter for other antenatal screening follow-up: Secondary | ICD-10-CM | POA: Diagnosis not present

## 2019-12-08 DIAGNOSIS — O24410 Gestational diabetes mellitus in pregnancy, diet controlled: Secondary | ICD-10-CM | POA: Diagnosis not present

## 2019-12-08 DIAGNOSIS — O09522 Supervision of elderly multigravida, second trimester: Secondary | ICD-10-CM | POA: Diagnosis not present

## 2019-12-08 DIAGNOSIS — Z3A26 26 weeks gestation of pregnancy: Secondary | ICD-10-CM

## 2019-12-08 DIAGNOSIS — O10919 Unspecified pre-existing hypertension complicating pregnancy, unspecified trimester: Secondary | ICD-10-CM

## 2019-12-08 DIAGNOSIS — O10012 Pre-existing essential hypertension complicating pregnancy, second trimester: Secondary | ICD-10-CM

## 2019-12-08 DIAGNOSIS — O24419 Gestational diabetes mellitus in pregnancy, unspecified control: Secondary | ICD-10-CM | POA: Diagnosis present

## 2019-12-08 DIAGNOSIS — O0990 Supervision of high risk pregnancy, unspecified, unspecified trimester: Secondary | ICD-10-CM | POA: Diagnosis present

## 2019-12-08 DIAGNOSIS — O99212 Obesity complicating pregnancy, second trimester: Secondary | ICD-10-CM

## 2019-12-09 ENCOUNTER — Encounter: Payer: Self-pay | Admitting: Obstetrics and Gynecology

## 2019-12-09 ENCOUNTER — Ambulatory Visit (INDEPENDENT_AMBULATORY_CARE_PROVIDER_SITE_OTHER): Payer: Medicaid Other | Admitting: Obstetrics and Gynecology

## 2019-12-09 VITALS — BP 141/79 | HR 98 | Wt 224.0 lb

## 2019-12-09 DIAGNOSIS — O34219 Maternal care for unspecified type scar from previous cesarean delivery: Secondary | ICD-10-CM

## 2019-12-09 DIAGNOSIS — O0990 Supervision of high risk pregnancy, unspecified, unspecified trimester: Secondary | ICD-10-CM

## 2019-12-09 DIAGNOSIS — Z3A27 27 weeks gestation of pregnancy: Secondary | ICD-10-CM

## 2019-12-09 DIAGNOSIS — O24410 Gestational diabetes mellitus in pregnancy, diet controlled: Secondary | ICD-10-CM

## 2019-12-09 DIAGNOSIS — O10919 Unspecified pre-existing hypertension complicating pregnancy, unspecified trimester: Secondary | ICD-10-CM

## 2019-12-09 DIAGNOSIS — O0993 Supervision of high risk pregnancy, unspecified, third trimester: Secondary | ICD-10-CM

## 2019-12-09 DIAGNOSIS — O10913 Unspecified pre-existing hypertension complicating pregnancy, third trimester: Secondary | ICD-10-CM

## 2019-12-09 DIAGNOSIS — O24419 Gestational diabetes mellitus in pregnancy, unspecified control: Secondary | ICD-10-CM

## 2019-12-09 MED ORDER — GLYBURIDE 2.5 MG PO TABS: 2.5000 mg | ORAL_TABLET | Freq: Two times a day (BID) | ORAL | 3 refills | Status: DC

## 2019-12-09 NOTE — Progress Notes (Addendum)
HROB. BS ranges Fasting 94-135, after meals 94-118.

## 2019-12-09 NOTE — Progress Notes (Signed)
   PRENATAL VISIT NOTE  Subjective:  Kristin Lucas is a 39 y.o. G2P1001 at [redacted]w[redacted]d being seen today for ongoing prenatal care.  She is currently monitored for the following issues for this high-risk pregnancy and has Supervision of high risk pregnancy, antepartum; Chronic hypertension affecting pregnancy; History of cesarean delivery affecting pregnancy; Family history of breast cancer; Elevated hemoglobin A1c; Gestational diabetes mellitus (GDM) affecting pregnancy, antepartum; and Abnormal glucose tolerance test (GTT) during pregnancy, antepartum on their problem list.  Patient reports no complaints.  Contractions: Not present. Vag. Bleeding: None.  Movement: Present. Denies leaking of fluid.   The following portions of the patient's history were reviewed and updated as appropriate: allergies, current medications, past family history, past medical history, past social history, past surgical history and problem list.   Objective:   Vitals:   12/09/19 1433  BP: (!) 141/79  Pulse: 98  Weight: 224 lb (101.6 kg)    Fetal Status: Fetal Heart Rate (bpm): 159   Movement: Present     General:  Alert, oriented and cooperative. Patient is in no acute distress.  Skin: Skin is warm and dry. No rash noted.   Cardiovascular: Normal heart rate noted  Respiratory: Normal respiratory effort, no problems with respiration noted  Abdomen: Soft, gravid, appropriate for gestational age.  Pain/Pressure: Absent     Pelvic: Cervical exam deferred        Extremities: Normal range of motion.  Edema: Trace  Mental Status: Normal mood and affect. Normal behavior. Normal judgment and thought content.   Assessment and Plan:  Pregnancy: G2P1001 at [redacted]w[redacted]d 1. Supervision of high risk pregnancy, antepartum Patient is doing well without complaints Patient desires BTL  2. Gestational diabetes mellitus (GDM) affecting pregnancy, antepartum CBGs reviewed and patient with persistently elevated fasting greater than 95 up  to 135. Most pp within range Patient does not like needles and is trying to avoid changing to insulin. Patient agrees to trial of glyburide Will start glyburide 2.5 mg qHS   3. Chronic hypertension affecting pregnancy Continue labetalol and ASA Follow up growth ultrasound 4/1  4. History of cesarean delivery affecting pregnancy Patient desires repeat with BTL  Preterm labor symptoms and general obstetric precautions including but not limited to vaginal bleeding, contractions, leaking of fluid and fetal movement were reviewed in detail with the patient. Please refer to After Visit Summary for other counseling recommendations.   No follow-ups on file.  Future Appointments  Date Time Provider Department Center  01/07/2020  1:45 PM WH-MFC NURSE WH-MFC MFC-US  01/07/2020  1:45 PM WH-MFC [place] 5 WH-MFCUS MFC-US    Catalina Antigua, MD

## 2019-12-10 LAB — CBC
Hematocrit: 32.4 % — ABNORMAL LOW (ref 34.0–46.6)
Hemoglobin: 11.2 g/dL (ref 11.1–15.9)
MCH: 29 pg (ref 26.6–33.0)
MCHC: 34.6 g/dL (ref 31.5–35.7)
MCV: 84 fL (ref 79–97)
Platelets: 271 10*3/uL (ref 150–450)
RBC: 3.86 x10E6/uL (ref 3.77–5.28)
RDW: 14.4 % (ref 11.7–15.4)
WBC: 11.6 10*3/uL — ABNORMAL HIGH (ref 3.4–10.8)

## 2019-12-10 LAB — RPR: RPR Ser Ql: NONREACTIVE

## 2019-12-10 LAB — HIV ANTIBODY (ROUTINE TESTING W REFLEX): HIV Screen 4th Generation wRfx: NONREACTIVE

## 2019-12-11 ENCOUNTER — Other Ambulatory Visit: Payer: Self-pay | Admitting: Family Medicine

## 2019-12-11 ENCOUNTER — Encounter: Payer: Self-pay | Admitting: Obstetrics and Gynecology

## 2019-12-11 DIAGNOSIS — O34219 Maternal care for unspecified type scar from previous cesarean delivery: Secondary | ICD-10-CM

## 2019-12-11 NOTE — Progress Notes (Signed)
Patient is checking CBGs 4 times daily but not importing into [facility] program. Will contact patient to see if she desires to use BRx, otherwise will remove patient from program

## 2019-12-14 ENCOUNTER — Telehealth: Payer: Self-pay

## 2019-12-14 NOTE — Telephone Encounter (Signed)
Returned call and pt informed that she tested positive for COVID yesterday. Pt reports loss of smell and decreased appetite, informed office manager and provider.

## 2019-12-23 ENCOUNTER — Telehealth (INDEPENDENT_AMBULATORY_CARE_PROVIDER_SITE_OTHER): Payer: Medicaid Other | Admitting: Obstetrics and Gynecology

## 2019-12-23 ENCOUNTER — Encounter: Payer: Self-pay | Admitting: Obstetrics and Gynecology

## 2019-12-23 VITALS — BP 140/90

## 2019-12-23 DIAGNOSIS — O10013 Pre-existing essential hypertension complicating pregnancy, third trimester: Secondary | ICD-10-CM

## 2019-12-23 DIAGNOSIS — O24415 Gestational diabetes mellitus in pregnancy, controlled by oral hypoglycemic drugs: Secondary | ICD-10-CM

## 2019-12-23 DIAGNOSIS — O0990 Supervision of high risk pregnancy, unspecified, unspecified trimester: Secondary | ICD-10-CM

## 2019-12-23 DIAGNOSIS — O34219 Maternal care for unspecified type scar from previous cesarean delivery: Secondary | ICD-10-CM

## 2019-12-23 DIAGNOSIS — Z3A29 29 weeks gestation of pregnancy: Secondary | ICD-10-CM

## 2019-12-23 DIAGNOSIS — O10919 Unspecified pre-existing hypertension complicating pregnancy, unspecified trimester: Secondary | ICD-10-CM

## 2019-12-23 DIAGNOSIS — O24419 Gestational diabetes mellitus in pregnancy, unspecified control: Secondary | ICD-10-CM

## 2019-12-23 DIAGNOSIS — O0993 Supervision of high risk pregnancy, unspecified, third trimester: Secondary | ICD-10-CM

## 2019-12-23 NOTE — Progress Notes (Signed)
    PRENATAL VIRTUAL VISIT ENCOUNTER NOTE  Provider location: Center for [facility] [place]   I connected with [name] on 12/23/19 at  3:00 PM EDT by MyChart Video Encounter at home and verified that I am speaking with the correct person using two identifiers.    I discussed the limitations, risks, security and privacy concerns of performing an evaluation and management service virtually and the availability of in person appointments. I also discussed with the patient that there may be a patient responsible charge related to this service. The patient expressed understanding and agreed to proceed.   Subjective:  Kristin Lucas is a 39 y.o. G2P1001 at [redacted]w[redacted]d being seen today for ongoing prenatal care.  She is currently monitored for the following issues for this high-risk pregnancy and has Supervision of high risk pregnancy, antepartum; Chronic hypertension affecting pregnancy; History of cesarean delivery affecting pregnancy; Family history of breast cancer; Elevated hemoglobin A1c; Gestational diabetes mellitus (GDM) affecting pregnancy, antepartum; and Abnormal glucose tolerance test (GTT) during pregnancy, antepartum on their problem list.  Patient reports generalized .   .  .   . Denies leaking of fluid.   The following portions of the patient's history were reviewed and updated as appropriate: allergies, current medications, past family history, past medical history, past social history, past surgical history and problem list.   Objective:   Vitals:   12/23/19 1515  BP: 140/90    Fetal Status:           General:  Alert, oriented and cooperative. Patient is in no acute distress.  Mental Status: Normal mood and affect. Normal behavior. Normal judgment and thought content.   Assessment and Plan:  Pregnancy: G2P1001 at [redacted]w[redacted]d 1. Supervision of high risk pregnancy, antepartum Patient reports generalized fatigue and weakness since covid diagnosis. She states that she tested  negative this past week.  She reports never experiencing SOB, cough or fever  2. Gestational diabetes mellitus (GDM) affecting pregnancy, antepartum Patient reports increase in appetite and thus elevated CBG 170-200. Patient declines admission to the hospital for glycemic control Discussed portion control as patient seems to be eating every 2 hours. She also admits to 2-3 am snacking Will re-evaluate CBG next week. Again discussed insulin and patient is resistant. Will continue glyburide for now.  Follow up growth 4/1 Patient is not using babyRx to log CBG  3. Chronic hypertension affecting pregnancy Continue labetalol and ASA  4. History of cesarean delivery affecting pregnancy Desires repeat with BTL  Preterm labor symptoms and general obstetric precautions including but not limited to vaginal bleeding, contractions, leaking of fluid and fetal movement were reviewed in detail with the patient. Please refer to After Visit Summary for other counseling recommendations.   No follow-ups on file.  Future Appointments  Date Time Provider Department Center  01/07/2020  1:45 PM [facility]-MFC NURSE WH-MFC MFC-US  01/07/2020  1:45 PM WH-MFC [place] 5 WH-MFCUS MFC-US    Catalina Antigua, MD

## 2019-12-30 ENCOUNTER — Telehealth (INDEPENDENT_AMBULATORY_CARE_PROVIDER_SITE_OTHER): Payer: Medicaid Other | Admitting: Obstetrics & Gynecology

## 2019-12-30 DIAGNOSIS — Z3A30 30 weeks gestation of pregnancy: Secondary | ICD-10-CM | POA: Diagnosis not present

## 2019-12-30 DIAGNOSIS — O24419 Gestational diabetes mellitus in pregnancy, unspecified control: Secondary | ICD-10-CM

## 2019-12-30 DIAGNOSIS — O10913 Unspecified pre-existing hypertension complicating pregnancy, third trimester: Secondary | ICD-10-CM

## 2019-12-30 DIAGNOSIS — O0990 Supervision of high risk pregnancy, unspecified, unspecified trimester: Secondary | ICD-10-CM

## 2019-12-30 DIAGNOSIS — O10919 Unspecified pre-existing hypertension complicating pregnancy, unspecified trimester: Secondary | ICD-10-CM

## 2019-12-30 MED ORDER — INSULIN STARTER KIT- SYRINGES (ENGLISH): 1.0000 | Freq: Once | Status: DC

## 2019-12-30 MED ORDER — INSULIN NPH (HUMAN) (ISOPHANE) 100 UNIT/ML ~~LOC~~ SUSP: 10.0000 [IU] | Freq: Every day | SUBCUTANEOUS | 3 refills | Status: DC

## 2019-12-30 NOTE — Progress Notes (Signed)
   TELEHEALTH VIRTUAL OBSTETRICS VISIT ENCOUNTER NOTE  I connected with Kristin Lucas on 12/30/19 at  1:30 PM EDT by telephone at home and verified that I am speaking with the correct person using two identifiers.   I discussed the limitations, risks, security and privacy concerns of performing an evaluation and management service by telephone and the availability of in person appointments. I also discussed with the patient that there may be a patient responsible charge related to this service. The patient expressed understanding and agreed to proceed.  Subjective:  Kristin Lucas is a 39 y.o. G2P1001 at [redacted]w[redacted]d being followed for ongoing prenatal care.  She is currently monitored for the following issues for this high-risk pregnancy and has Supervision of high risk pregnancy, antepartum; Chronic hypertension affecting pregnancy; History of cesarean delivery affecting pregnancy; Family history of breast cancer; Elevated hemoglobin A1c; Gestational diabetes mellitus (GDM) affecting pregnancy, antepartum; and Abnormal glucose tolerance test (GTT) during pregnancy, antepartum on their problem list.  Patient reports no complaints. Reports fetal movement. Denies any contractions, bleeding or leaking of fluid.   The following portions of the patient's history were reviewed and updated as appropriate: allergies, current medications, past family history, past medical history, past social history, past surgical history and problem list.   Objective:   General:  Alert, oriented and cooperative.   Mental Status: Normal mood and affect perceived. Normal judgment and thought content.  Rest of physical exam deferred due to type of encounter  Assessment and Plan:  Pregnancy: G2P1001 at [redacted]w[redacted]d 1. Gestational diabetes mellitus (GDM) affecting pregnancy, antepartum Reviewed her elevated FBS values and stressed importance of control. D/c glyburide, take metformin AM,  - Ambulatory referral to Nutrition and Diabetic  Education - insulin NPH Human (NOVOLIN N) 100 UNIT/ML injection; Inject 0.1 mLs (10 Units total) into the skin daily with supper.  Dispense: 10 mL; Refill: 3  2. Chronic hypertension affecting pregnancy Labetalol 200 mg BID  3. Supervision of high risk pregnancy, antepartum Start BPP in 2 weeks  Preterm labor symptoms and general obstetric precautions including but not limited to vaginal bleeding, contractions, leaking of fluid and fetal movement were reviewed in detail with the patient.  I discussed the assessment and treatment plan with the patient. The patient was provided an opportunity to ask questions and all were answered. The patient agreed with the plan and demonstrated an understanding of the instructions. The patient was advised to call back or seek an in-person office evaluation/go to MAU at [facility] for any urgent or concerning symptoms. Please refer to After Visit Summary for other counseling recommendations.   I provided 15 minutes of non-face-to-face time during this encounter.  Return in about 1 week (around 01/06/2020) for virtual.  Future Appointments  Date Time Provider Department Center  01/07/2020  1:45 PM WH-MFC NURSE WH-MFC MFC-US  01/07/2020  1:45 PM WH-MFC [place] 5 WH-MFCUS MFC-US    Scheryl Darter, MD Center for [facility], [facility]

## 2019-12-30 NOTE — Progress Notes (Signed)
I connected with  Kristin Lucas on 12/30/19 by a video enabled telemedicine application and verified that I am speaking with the correct person using two identifiers.   I discussed the limitations of evaluation and management by telemedicine. The patient expressed understanding and agreed to proceed.  ROB.  BS Fasting: 225-175   After meals 180-125

## 2019-12-30 NOTE — Patient Instructions (Signed)
Insulin Injection Instructions, Mixing Two Insulins, Adult A subcutaneous injection is a shot of medicine that is injected into the layer of fat and tissue between skin and muscle. People with type 1 diabetes must take insulin because their bodies do not make it. People with type 2 diabetes may need to take insulin.  There are many different types of insulin. The type of insulin that you take may determine how many injections you give yourself and when you need to give the injections. You may be instructed to combine two types of insulin for your injection. When mixing insulin in the same syringe:  The CLEAR insulin is rapid-acting or regular insulin.  The CLOUDY insulin is intermediate or long-acting insulin. It may also be called NPH insulin. Supplies needed:  Soap and water to wash hands.  A new, unused insulin syringe.  Your insulin medication bottles (vials).  Alcohol wipes.  A disposal container that is meant for sharp items (sharps container), such as an empty plastic bottle with a cover. How to choose a site for injection The body absorbs insulin differently, depending on where the insulin is injected (injection site). It is best to inject insulin into the same body area each time (for example, always in the abdomen), but you should use a different spot in that area for each injection. Do not inject the insulin in the same spot each time. There are five main areas that can be used for injecting. These areas include:  Abdomen. This is the preferred area.  Front of thigh.  Upper, outer side of thigh.  Upper, outer side of arm.  Upper, outer part of buttock. How to give an insulin injection using mixed insulin First, follow the steps for Get ready, then continue with the steps for Push air into the vials, then follow the steps for Combine clear insulin and cloudy insulin, and finish with the steps for Inject the insulin mixture. Get ready 1. Wash your hands with soap and water.  If soap and water are not available, use hand sanitizer. 2. Before you give yourself an insulin injection, be sure to test your blood sugar level (blood glucose level) and write down that number. Follow any instructions from your health care provider about what to do if your blood glucose level is higher or lower than your normal range. 3. Use a new, unused insulin syringe each time you need to inject insulin. 4. Check to make sure you have the correct type of insulin syringe for the concentration of insulin that you are using. 5. Check the expiration dates and the types of insulin that you are using. 6. Check the CLEAR insulin. It should be clear and free of clumps. 7. Do not shake the CLOUDY insulin vial to get it ready. Instead, get it ready in one of these ways: ? Gently roll that insulin vial between your palms several times. ? Tip that vial up and down several times. 8. Remove the plastic pop-top covering from the vial of CLEAR insulin and the vial of CLOUDY insulin. This type of covering is present on a vial when it is new. 9. Use an alcohol wipe to clean the rubber top of each vial. 10. Remove the plastic cover from the syringe needle. Do not let the needle touch anything. Push air into the vials  1. To bring (draw up) air into the syringe, slowly pull back on the syringe plunger. Stop pulling the plunger when the dose indicator gets to the number of units  that you will be using for CLOUDY insulin. 2. While you keep the vial of CLOUDY insulin right-side-up, poke the needle through the rubber top of the vial. Do not turn the vial upside down to do this. 3. Push the plunger all the way into the syringe. Doing that will push air into the vial of CLOUDY insulin. 4. Remove the needle from the vial of CLOUDY insulin. Do not turn the vial upside down to do this. 5. Slowly pull back on the plunger to repeat the step for drawing up air into the syringe. Stop pulling the plunger when the dose  indicator gets to the number of units that you will be using for CLEAR insulin. 6. While you keep the vial of CLEAR insulin right-side-up, poke the needle through the rubber top of the vial. Do not turn the vial upside down to do this. 7. Push the plunger all the way into the syringe. Doing that will push air into the vial of CLEAR insulin. 8. Do not take the needle out of the vial of CLEAR insulin yet. Combine clear insulin and cloudy insulin 1. While the needle is still in the vial of CLEAR insulin, turn that vial upside down and hold it at eye level. 2. Slowly pull back on the plunger to draw up the desired number of CLEAR insulin units into the syringe. 3. If you see air bubbles in the syringe, slowly move the plunger up and down 2 or 3 times to make them go away. 4. Remove the needle from the vial of CLEAR insulin. 5. Poke the needle of the partly filled syringe into the vial of CLOUDY insulin, turn the vial upside down, then hold the vial at eye level. Do not inject any of the CLEAR insulin (which is already in the syringe) into the vial of CLOUDY insulin. 6. Slowly pull back on the plunger until it gets to the number of units that is equal to the total number of units desired (CLEAR insulin units plus CLOUDY insulin units). 7. Remove the needle from the vial of CLOUDY insulin. 8. Do not let the needle touch anything. Inject the insulin mixture  1. Use an alcohol wipe to clean the site where you will be injecting the needle. Let the site air-dry. 2. Hold the syringe in your writing hand like a pencil. 3. Use your other hand to pinch and hold about an inch (2.5 cm) of skin. Do not directly touch the cleaned part of the skin. 4. Gently but quickly, put the needle straight into the skin. The needle should be at a 90-degree angle (perpendicular) to the skin. 5. Push the needle in as far as it will go (to the hub). 6. When the needle is completely inserted into the skin, use your thumb or index  finger of your writing hand to push the plunger all the way into the syringe to inject the insulin. 7. Let go of the skin that you are pinching. Continue to hold the syringe in place with your writing hand. 8. Wait 10 seconds, then pull the needle straight out of the skin. This will allow all of the insulin to go from the syringe and needle into your body. 9. Press and hold the alcohol wipe over the injection site until any bleeding stops. Do not rub the area. 10. Do not put the plastic cover back on the needle. 11. Discard the syringe and needle directly into a sharps container, such as an empty plastic bottle with  a cover. How to throw away supplies  Discard all used needles in a puncture-proof sharps disposal container. You can ask your local pharmacy about where you can get this kind of disposal container, or you can use an empty plastic liquid laundry detergent bottle that has a cover.  Follow the disposal regulations for the area where you live. Do not use any syringe or needle more than one time.  Throw away empty vials in the regular trash. Questions to ask your health care provider  How often should I be taking insulin?  How often should I check my blood glucose?  What amount of CLEAR insulin do I need for each injection?  What amount of CLOUDY insulin do I need for each injection?  What are the side effects?  What should I do if my blood glucose is too high?  What should I do if my blood glucose is too low?  What should I do if I forget to take my insulin?  What number should I call if I have questions? Where to find more information  American Diabetes Association (ADA): www.diabetes.org  American Association of Diabetes Educators (AADE) Patient Resources: https://www.diabeteseducator.org Summary  A subcutaneous injection is a shot of medicine that is injected into the layer of fat and tissue between skin and muscle.  Before you give yourself an insulin injection,  be sure to test your blood sugar level (blood glucose level) and write down that number.  The type of insulin that you take may determine how many injections you give yourself and when you need to give the injections.  Check the expiration dates and the types of insulin that you are using.  It is best to inject insulin into the same body area each time (for example, always in the abdomen), but you should use a different spot in that area for each injection. This information is not intended to replace advice given to you by your health care provider. Make sure you discuss any questions you have with your health care provider. Document Revised: 09/27/2017 Document Reviewed: 10/28/2015 Elsevier Patient Education  2020 [employer].

## 2020-01-04 ENCOUNTER — Other Ambulatory Visit: Payer: Self-pay | Admitting: Obstetrics & Gynecology

## 2020-01-04 DIAGNOSIS — O24419 Gestational diabetes mellitus in pregnancy, unspecified control: Secondary | ICD-10-CM

## 2020-01-04 NOTE — Progress Notes (Signed)
Insulin training ordered

## 2020-01-05 ENCOUNTER — Encounter: Payer: Self-pay | Admitting: Registered"

## 2020-01-05 ENCOUNTER — Other Ambulatory Visit: Payer: Self-pay

## 2020-01-05 ENCOUNTER — Encounter: Payer: Medicaid Other | Attending: Obstetrics and Gynecology | Admitting: Registered"

## 2020-01-05 DIAGNOSIS — O99810 Abnormal glucose complicating pregnancy: Secondary | ICD-10-CM | POA: Diagnosis present

## 2020-01-05 DIAGNOSIS — O24419 Gestational diabetes mellitus in pregnancy, unspecified control: Secondary | ICD-10-CM | POA: Diagnosis present

## 2020-01-05 NOTE — Progress Notes (Signed)
Referral for insulin instruction.  MD orders are: NPH 10 units daily with supper  When patient arrived for insulin training she states told Dr. Elly Modena that she would not inject herself with a needle. Pt states Dr. Elly Modena and Dr. Roselie Awkward discussed alternative such as pens. RD did not see this in her notes. Since patient has Medicaid she is a candidate for Omnipod. The following learning objectives were met by the patient during this visit:   Insulin Action of rapid, intermediate and long-acting insulins  Used Omnipod demo to show where insulin goes into devices   General description of attachment of pod and control with Personal Diabetes Manager (PDM)  Discussed next steps to get device delivered and set up training.  Patient filled out Patient information for [facility], Omnipod rep, to contact her to start the next steps for receiving and using insulin via Omnipod.   Omnipod rep will contact Dr. Kennon Rounds tomorrow to get insulin orders signed and device shipped to patient                                        Patient will be seen for follow-up as needed.

## 2020-01-05 NOTE — Patient Instructions (Signed)
Thank you for coming in today to discuss insulin options.  Watch for a call from a [phone] area code to discuss next steps to receive Omnipod insulin delivery system.  After you receive the omnipod and insulin you will need to schedule training with Cristal Deer who will be calling you to set that up.  Let me know if you have any questions

## 2020-01-06 ENCOUNTER — Telehealth (INDEPENDENT_AMBULATORY_CARE_PROVIDER_SITE_OTHER): Payer: Medicaid Other | Admitting: Obstetrics & Gynecology

## 2020-01-06 DIAGNOSIS — Z3A31 31 weeks gestation of pregnancy: Secondary | ICD-10-CM

## 2020-01-06 DIAGNOSIS — O24419 Gestational diabetes mellitus in pregnancy, unspecified control: Secondary | ICD-10-CM

## 2020-01-06 DIAGNOSIS — O0990 Supervision of high risk pregnancy, unspecified, unspecified trimester: Secondary | ICD-10-CM

## 2020-01-06 DIAGNOSIS — O10013 Pre-existing essential hypertension complicating pregnancy, third trimester: Secondary | ICD-10-CM

## 2020-01-06 DIAGNOSIS — O10919 Unspecified pre-existing hypertension complicating pregnancy, unspecified trimester: Secondary | ICD-10-CM

## 2020-01-06 NOTE — Progress Notes (Signed)
Pt states she is to be getting the Omnipod for diabetes management.  Pt was seen by dietician yesterday and completed paperwork for device.  Pt has no other concerns today.

## 2020-01-06 NOTE — Progress Notes (Signed)
   TELEHEALTH VIRTUAL OBSTETRICS VISIT ENCOUNTER NOTE  I connected with Kristin Lucas on 01/06/20 at  1:45 PM EDT by telephone at home and verified that I am speaking with the correct person using two identifiers.   I discussed the limitations, risks, security and privacy concerns of performing an evaluation and management service by telephone and the availability of in person appointments. I also discussed with the patient that there may be a patient responsible charge related to this service. The patient expressed understanding and agreed to proceed.  Subjective:  Kristin Lucas is a 39 y.o. G2P1001 at [redacted]w[redacted]d being followed for ongoing prenatal care.  She is currently monitored for the following issues for this high-risk pregnancy and has Supervision of high risk pregnancy, antepartum; Chronic hypertension affecting pregnancy; History of cesarean delivery affecting pregnancy; Family history of breast cancer; Elevated hemoglobin A1c; Gestational diabetes mellitus (GDM) affecting pregnancy, antepartum; and Abnormal glucose tolerance test (GTT) during pregnancy, antepartum on their problem list.  Patient reports no complaints. Reports fetal movement. Denies any contractions, bleeding or leaking of fluid.   The following portions of the patient's history were reviewed and updated as appropriate: allergies, current medications, past family history, past medical history, past social history, past surgical history and problem list.   Objective:   General:  Alert, oriented and cooperative.   Mental Status: Normal mood and affect perceived. Normal judgment and thought content.  Rest of physical exam deferred due to type of encounter  Assessment and Plan:  Pregnancy: G2P1001 at [redacted]w[redacted]d 1. Gestational diabetes mellitus (GDM) affecting pregnancy, antepartum Waiting to get Omnipod as she says she cannot do injections any other way  2. Chronic hypertension affecting pregnancy Increase to 200 mg labetalol  TID  3. Supervision of high risk pregnancy, antepartum F/U US is scheduled, start antenatal testing in one week  Preterm labor symptoms and general obstetric precautions including but not limited to vaginal bleeding, contractions, leaking of fluid and fetal movement were reviewed in detail with the patient.  I discussed the assessment and treatment plan with the patient. The patient was provided an opportunity to ask questions and all were answered. The patient agreed with the plan and demonstrated an understanding of the instructions. The patient was advised to call back or seek an in-person office evaluation/go to [facility]U at [facility] for any urgent or concerning symptoms. Please refer to After Visit Summary for other counseling recommendations.   I provided 12 minutes of non-face-to-face time during this encounter.  Return in about 1 week (around 01/13/2020) for virtual.  Future Appointments  Date Time Provider Department Center  01/07/2020  1:45 PM WH-MFC NURSE WH-MFC MFC-US  01/07/2020  1:45 PM WH-MFC [place] 5 WH-MFCUS MFC-US    Scheryl Darter, MD Center for [facility], [facility]

## 2020-01-06 NOTE — Patient Instructions (Signed)
Insulin Pumps  An insulin pump is a small, computerized, battery-powered device that steadily (continuously) delivers insulin to the body. Insulin pumps may be used to treat type 1 diabetes or type 2 diabetes. They may be used with rapid-acting insulin or short-acting insulin. An insulin pump has a container (cartridge or reservoir) of insulin that must be refilled regularly. The cartridge is connected to a needle that is placed into the tissue between skin and muscle in any of these areas:  Abdomen. Avoid any area that is less than 2 inches (5 cm) from the belly button (navel).  Front of thigh.  Upper, outer side of thigh.  Upper, outer part of arm.  Upper, outer part of buttock. The area where the needle is inserted is called the infusion site. The needle is surrounded by a small plastic tube (cannula). Together, the needle and cannula are referred to as the infusion set. After the infusion set is inserted under the skin, the needle is removed and the cannula stays in place to deliver insulin to the body. What does an insulin pump do? Insulin pumps deliver insulin to the body in the following ways:  Basal rate. This method gives small, continuous amounts of insulin 24 hours a day.  Bolus dose. This is a specific amount of insulin that you can give yourself right away. You may need this after eating a meal or when you have high blood sugar (glucose). You will be instructed on the amount to give depending on your blood glucose level reading. What are some advantages of using an insulin pump? The advantages of using an insulin pump vary depending on which type of diabetes you have. Advantages may include:  Reduced need to give yourself multiple insulin injections. With an insulin pump, you need to insert a needle into your body one time every 2-3 days instead of every time that you need insulin.  More control over your diabetes and more flexibility for scheduling meals, snacks, and  exercise.  Reduced risk of low blood glucose (hypoglycemia).  Better management of the increases in blood glucose in the early morning (dawn phenomenon).  More precise insulin doses, which may mean an improvement in blood glucose levels.  A more predictable insulin absorption rate.  Easier delivery of basal rate insulin. What are some disadvantages of using an insulin pump?  An insulin pump and its supplies might cost more than the supplies for injections.  You may need to check your blood glucose level more often than when you give yourself injections.  You have the pump attached to you wherever you go, for 24 hours a day. What are the risks?  The infusion site can get irritated or infected. To avoid those risks: ? Care for your skin. ? Change the infusion set as directed by your health care provider. ? Periodically move your infusion site to a slightly different area.  Pump failure. This can be caused by a malfunction, such as a blockage in the tubing, the needle moving out of place, or the pump running out of insulin.  Most pumps have an alarm to warn you of a malfunction. However, if the pump fails and you are unaware that it has failed, you may develop diabetic ketoacidosis. This is a life-threatening complication of diabetes that can occur due to lack of insulin.  Blockage (occlusion). This can prevent your pump from delivering insulin properly. This can be caused by a bent cannula or a kink in the tubing. What may cause high   blood glucose when using an insulin pump? Possible causes of high blood glucose (hyperglycemia) when using an insulin pump include:  Tubing that breaks, leaks, bends, or moves out of place.  Low charge in a battery.  Infection at the infusion site. Signs of infection may include: ? Redness, swelling, or pain. ? Blood or cloudy fluid. ? Warmth. ? Pus or a bad smell.  A cartridge that is empty or incorrectly loaded into the pump.  A basal rate  that needs to be adjusted.  Air bubbles in the tubing.  Illness or stress.  Changes in hormone levels, such as during a menstrual cycle.  The pump delivering the wrong amount of insulin.  An interruption in insulin delivery.  Poor absorption at the infusion site, even if you recently moved the site.  Using ineffective insulin. Insulin can become ineffective if it is outdated or exposed to extreme heat or cold.  A change in your normal activity level. How to care for your insulin pump  Refill the insulin cartridge as often as needed. Do not let the cartridge get completely empty.  Always keep extra pump supplies, syringes, and insulin with you.  Store your insulin according to instructions on the packaging.  If you have questions or a problem that you cannot solve, call your health care provider or call the pump manufacturer's customer service line. How to manage your diabetes while using an insulin pump   Check your blood glucose 4 or more times a day, or as often as directed.  Always check your blood glucose at the following times: ? Before you change your basal or bolus rates. ? After you change your infusion set.  Check your A1c (hemoglobin A1c) level as often as directed.  Continue to manage your diabetes as directed. This includes: ? Exercising regularly. ? Eating healthy foods. ? Managing your weight.  If you give yourself a correction (supplemental) insulin dose and your blood glucose stays high, check your urine for ketones. A supplemental dose is a specific amount of rapid-acting or short-acting insulin that is used to lower blood glucose if it is too high. You may be instructed to check your blood glucose at certain times of the day and to use corrective insulin as needed. ? If you have negative ketones, check your pump to see if it is working properly. If your pump does not seem to be working properly, change your cartridge, infusion set, and insertion site.  Recheck your blood glucose in 1 hour. If your blood glucose is still high, give yourself an additional supplemental insulin dose with an injection using a syringe and needle. ? If you have moderate or large ketones in your urine, give yourself an additional supplemental insulin dose with an injection using a syringe and needle. Contact your health care provider for additional instructions. Contact a health care provider if:  You have any of the following at your infusion site: ? Redness, swelling, or pain. ? Blood or cloudy fluid. ? Warmth. ? Pus or a bad smell. ? A red streak on your skin that leads away from the infusion site.  You have a fever.  You have moderate or large ketone levels in your urine.  Your pump is not working properly.  Your blood glucose is higher or lower than the target range that your health care provider gave you. Summary  An insulin pump is a small, computerized, battery-powered device that steadily (continuously) delivers insulin to the body.  Insulin pumps may be   used to treat type 1 diabetes (type 1 diabetes mellitus) or type 2 diabetes (type 2 diabetes mellitus).  After the infusion set is inserted under the skin, the needle is removed and the cannula stays in place to deliver insulin to the body.  Always check your blood glucose before you change your basal or bolus rates, and after you change your infusion set.  Refill the insulin cartridge as often as needed. Do not let the cartridge get completely empty. Always keep extra pump supplies, syringes, and insulin with you. This information is not intended to replace advice given to you by your health care provider. Make sure you discuss any questions you have with your health care provider. Document Revised: 09/27/2017 Document Reviewed: 10/28/2015 Elsevier Patient Education  2020 [employer]  

## 2020-01-07 ENCOUNTER — Encounter (HOSPITAL_COMMUNITY): Payer: Self-pay

## 2020-01-07 ENCOUNTER — Other Ambulatory Visit: Payer: Self-pay

## 2020-01-07 ENCOUNTER — Ambulatory Visit (HOSPITAL_COMMUNITY)
Admission: RE | Admit: 2020-01-07 | Discharge: 2020-01-07 | Disposition: A | Discharge status: Home / Self Care | Payer: Medicaid Other | Source: Ambulatory Visit | Attending: Obstetrics and Gynecology | Admitting: Obstetrics and Gynecology

## 2020-01-07 ENCOUNTER — Ambulatory Visit (HOSPITAL_COMMUNITY): Payer: Medicaid Other | Admitting: *Deleted

## 2020-01-07 DIAGNOSIS — Z362 Encounter for other antenatal screening follow-up: Secondary | ICD-10-CM

## 2020-01-07 DIAGNOSIS — O24419 Gestational diabetes mellitus in pregnancy, unspecified control: Secondary | ICD-10-CM | POA: Insufficient documentation

## 2020-01-07 DIAGNOSIS — O10919 Unspecified pre-existing hypertension complicating pregnancy, unspecified trimester: Secondary | ICD-10-CM | POA: Diagnosis present

## 2020-01-07 DIAGNOSIS — O0990 Supervision of high risk pregnancy, unspecified, unspecified trimester: Secondary | ICD-10-CM | POA: Diagnosis present

## 2020-01-07 DIAGNOSIS — O24410 Gestational diabetes mellitus in pregnancy, diet controlled: Secondary | ICD-10-CM

## 2020-01-07 DIAGNOSIS — O10019 Pre-existing essential hypertension complicating pregnancy, unspecified trimester: Secondary | ICD-10-CM

## 2020-01-07 DIAGNOSIS — Z3A31 31 weeks gestation of pregnancy: Secondary | ICD-10-CM

## 2020-01-07 DIAGNOSIS — O09523 Supervision of elderly multigravida, third trimester: Secondary | ICD-10-CM

## 2020-01-07 DIAGNOSIS — O34219 Maternal care for unspecified type scar from previous cesarean delivery: Secondary | ICD-10-CM

## 2020-01-07 DIAGNOSIS — O99213 Obesity complicating pregnancy, third trimester: Secondary | ICD-10-CM

## 2020-01-11 ENCOUNTER — Other Ambulatory Visit (HOSPITAL_COMMUNITY): Payer: Self-pay | Admitting: *Deleted

## 2020-01-11 DIAGNOSIS — O24415 Gestational diabetes mellitus in pregnancy, controlled by oral hypoglycemic drugs: Secondary | ICD-10-CM

## 2020-01-13 ENCOUNTER — Ambulatory Visit: Payer: Medicaid Other

## 2020-01-14 ENCOUNTER — Other Ambulatory Visit: Payer: Self-pay

## 2020-01-14 ENCOUNTER — Encounter (HOSPITAL_COMMUNITY): Payer: Self-pay | Admitting: Family Medicine

## 2020-01-14 ENCOUNTER — Telehealth (INDEPENDENT_AMBULATORY_CARE_PROVIDER_SITE_OTHER): Payer: Medicaid Other | Admitting: Obstetrics & Gynecology

## 2020-01-14 ENCOUNTER — Inpatient Hospital Stay (HOSPITAL_COMMUNITY)
Admission: AD | Admit: 2020-01-14 | Discharge: 2020-01-14 | Disposition: A | Discharge status: Home / Self Care | Payer: Medicaid Other | Attending: Family Medicine | Admitting: Family Medicine

## 2020-01-14 DIAGNOSIS — O0993 Supervision of high risk pregnancy, unspecified, third trimester: Secondary | ICD-10-CM

## 2020-01-14 DIAGNOSIS — Z7982 Long term (current) use of aspirin: Secondary | ICD-10-CM | POA: Diagnosis not present

## 2020-01-14 DIAGNOSIS — Z833 Family history of diabetes mellitus: Secondary | ICD-10-CM | POA: Diagnosis not present

## 2020-01-14 DIAGNOSIS — O10919 Unspecified pre-existing hypertension complicating pregnancy, unspecified trimester: Secondary | ICD-10-CM

## 2020-01-14 DIAGNOSIS — Z3A32 32 weeks gestation of pregnancy: Secondary | ICD-10-CM

## 2020-01-14 DIAGNOSIS — Z7984 Long term (current) use of oral hypoglycemic drugs: Secondary | ICD-10-CM | POA: Diagnosis not present

## 2020-01-14 DIAGNOSIS — O24419 Gestational diabetes mellitus in pregnancy, unspecified control: Secondary | ICD-10-CM

## 2020-01-14 DIAGNOSIS — O368130 Decreased fetal movements, third trimester, not applicable or unspecified: Secondary | ICD-10-CM | POA: Diagnosis present

## 2020-01-14 DIAGNOSIS — Z8249 Family history of ischemic heart disease and other diseases of the circulatory system: Secondary | ICD-10-CM | POA: Diagnosis not present

## 2020-01-14 DIAGNOSIS — O10013 Pre-existing essential hypertension complicating pregnancy, third trimester: Secondary | ICD-10-CM

## 2020-01-14 DIAGNOSIS — O24414 Gestational diabetes mellitus in pregnancy, insulin controlled: Secondary | ICD-10-CM

## 2020-01-14 DIAGNOSIS — O0990 Supervision of high risk pregnancy, unspecified, unspecified trimester: Secondary | ICD-10-CM

## 2020-01-14 DIAGNOSIS — Z79899 Other long term (current) drug therapy: Secondary | ICD-10-CM | POA: Diagnosis not present

## 2020-01-14 DIAGNOSIS — O10019 Pre-existing essential hypertension complicating pregnancy, unspecified trimester: Secondary | ICD-10-CM

## 2020-01-14 DIAGNOSIS — O34219 Maternal care for unspecified type scar from previous cesarean delivery: Secondary | ICD-10-CM

## 2020-01-14 LAB — URINALYSIS, ROUTINE W REFLEX MICROSCOPIC
Bilirubin Urine: NEGATIVE
Glucose, UA: >=500 mg/dL — AB
Ketones, ur: NEGATIVE mg/dL
Leukocytes,Ua: NEGATIVE
Nitrite: NEGATIVE
Protein, ur: NEGATIVE mg/dL
Specific Gravity, Urine: 1.027 (ref 1.005–1.030)
pH: 6 (ref 5.0–8.0)

## 2020-01-14 NOTE — MAU Provider Note (Signed)
History     [id number]  Arrival date and time: 01/14/20 0354    Chief Complaint  Patient presents with  . Decreased Fetal Movement     HPI Kristin Lucas is a 39 y.o. at 53w1dby LMP c/w 18wk [place] PMHx notable for cHTN on labetalol, GDM, prior CS, who presents for DFM.   Reports DFM throughout the day However after arrival to the MAU this has resolved and she feels significant movement Denies loss of fluid or vaginal bleeding Denies contractions Denies HA, vision changes, chest pain, SOB, LE edema   O/Positive/-- (11/23 1333)  OB History    Gravida  2   Para  1   Term  1   Preterm      AB      Living  1     SAB      TAB      Ectopic      Multiple      Live Births              Past Medical History:  Diagnosis Date  . Gestational diabetes   . Headache   . Hypertension     Past Surgical History:  Procedure Laterality Date  . CESAREAN SECTION  2007  . LIPOSUCTION      Family History  Problem Relation Age of Onset  . BRCA 1/2 Mother 574 . Diabetes Father   . Hypertension Father   . BRCA 1/2 Paternal Grandmother     Social History   Socioeconomic History  . Marital status: Single    Spouse name: Not on file  . Number of children: Not on file  . Years of education: Not on file  . Highest education level: Not on file  Occupational History  . Not on file  Tobacco Use  . Smoking status: Never Smoker  . Smokeless tobacco: Never Used  Substance and Sexual Activity  . Alcohol use: Not Currently    Comment: occ  . Drug use: No  . Sexual activity: Yes    Partners: Male    Birth control/protection: None  Other Topics Concern  . Not on file  Social History Narrative  . Not on file   Social Determinants of Health   Financial Resource Strain:   . Difficulty of Paying Living Expenses:   Food Insecurity:   . Worried About RCharity fundraiserin the Last Year:   . RArboriculturistin the Last Year:   Transportation Needs:   . LConsulting civil engineer(Medical):   .Marland KitchenLack of Transportation (Non-Medical):   Physical Activity:   . Days of Exercise per Week:   . Minutes of Exercise per Session:   Stress:   . Feeling of Stress :   Social Connections:   . Frequency of Communication with Friends and Family:   . Frequency of Social Gatherings with Friends and Family:   . Attends Religious Services:   . Active Member of Clubs or Organizations:   . Attends CArchivistMeetings:   .Marland KitchenMarital Status:   Intimate Partner Violence:   . Fear of Current or Ex-Partner:   . Emotionally Abused:   .Marland KitchenPhysically Abused:   . Sexually Abused:     No Known Allergies  No current facility-administered medications on file prior to encounter.   Current Outpatient Medications on File Prior to Encounter  Medication Sig Dispense Refill  . aspirin EC 81 MG tablet Take 1 tablet (81  mg total) by mouth daily. 30 tablet 10  . docusate sodium (COLACE) 100 MG capsule Take 1 capsule (100 mg total) by mouth 2 (two) times daily as needed. 30 capsule 2  . labetalol (NORMODYNE) 200 MG tablet Take 1 tablet (200 mg total) by mouth 2 (two) times daily. 60 tablet 0  . metFORMIN (GLUCOPHAGE) 1000 MG tablet Take 1,000 mg by mouth daily with breakfast.    . Prenat-Fe Poly-Methfol-FA-DHA (VITAFOL ULTRA) 29-0.6-0.4-200 MG CAPS Take 1 tablet by mouth daily. 30 capsule 12  . Accu-Chek Softclix Lancets lancets Use as instructed 100 each 12  . albuterol (PROVENTIL HFA;VENTOLIN HFA) 108 (90 BASE) MCG/ACT inhaler Inhale 2 puffs into the lungs every 6 (six) hours as needed for wheezing or shortness of breath. 1 Inhaler 0  . Blood Pressure KIT Use as directed 1 kit 0  . Blood Pressure Monitoring (BLOOD PRESSURE MONITOR/L CUFF) MISC Use as directed 1 each 0  . cyclobenzaprine (FLEXERIL) 5 MG tablet Take 1 tablet (5 mg total) by mouth 3 (three) times daily as needed for muscle spasms. (Patient not taking: Reported on 10/13/2019) 20 tablet 0  . Elastic Bandages &  Supports (COMFORT FIT MATERNITY SUPP MED) MISC 1 Device by Does not apply route daily. (Patient not taking: Reported on 10/26/2019) 1 each 0  . glucose blood test strip Test Blood Glucose 4 x a day. Fasting and 2 hours after breakfast, lunch and dinner. 100 each 12  . glyBURIDE (DIABETA) 2.5 MG tablet Take 1 tablet (2.5 mg total) by mouth 2 (two) times daily with a meal. (Patient not taking: Reported on 01/07/2020) 60 tablet 3  . insulin NPH Human (NOVOLIN N) 100 UNIT/ML injection Inject 0.1 mLs (10 Units total) into the skin daily with supper. (Patient not taking: Reported on 01/07/2020) 10 mL 3  . polyethylene glycol powder (GLYCOLAX/MIRALAX) 17 GM/SCOOP powder Take 17 g by mouth daily. (Patient not taking: Reported on 01/07/2020) 255 g 0     ROS Pertinent positives and negative per HPI, all others reviewed and negative  Physical Exam   BP 140/84   Pulse (!) 102   Temp 98.3 F (36.8 C)   Resp 18   Ht '5\' 7"'  (1.702 m)   Wt 102.5 kg   LMP 06/03/2019 (Exact Date)   BMI 35.40 kg/m   Physical Exam  Vitals reviewed. Constitutional: She appears well-developed and well-nourished. No distress.  Eyes: No scleral icterus.  Respiratory: Effort normal. No respiratory distress.  GI: Soft. She exhibits no distension. There is no abdominal tenderness. There is no rebound and no guarding.  Musculoskeletal:        General: No edema.  Neurological: She is alert. Coordination normal.  Skin: Skin is warm and dry. She is not diaphoretic.  Psychiatric: She has a normal mood and affect.    Cervical Exam  Deferred  Bedside Ultrasound Not done  FHT Baseline 140, moderate variability, +accels, -decels Toco no ctx Cat I  Labs Results for orders placed or performed during the hospital encounter of 01/14/20 (from the past 24 hour(s))  Urinalysis, Routine w reflex microscopic     Status: Abnormal   Collection Time: 01/14/20  4:31 AM  Result Value Ref Range   Color, Urine STRAW (A) YELLOW    APPearance HAZY (A) CLEAR   Specific Gravity, Urine 1.027 1.005 - 1.030   pH 6.0 5.0 - 8.0   Glucose, UA >=500 (A) NEGATIVE mg/dL   Hgb urine dipstick SMALL (A) NEGATIVE   Bilirubin  Urine NEGATIVE NEGATIVE   Ketones, ur NEGATIVE NEGATIVE mg/dL   Protein, ur NEGATIVE NEGATIVE mg/dL   Nitrite NEGATIVE NEGATIVE   Leukocytes,Ua NEGATIVE NEGATIVE   RBC / HPF 0-5 0 - 5 RBC/hpf   WBC, UA 0-5 0 - 5 WBC/hpf   Bacteria, UA RARE (A) NONE SEEN   Squamous Epithelial / LPF 0-5 0 - 5    Imaging No results found.  MAU Course  Procedures NST  MDM Low  Assessment and Plan  #DFM Resolved since arrival and reactive NST.  D/c to home in stable condition.   #FWB FHT Cat I NST reactive  Clarnce Flock

## 2020-01-14 NOTE — Progress Notes (Signed)
Dr Crissie Reese notified of pt's admission and status. Aware of decreased FM since yesterday, +FM in MAU, no pain, elevated B/P with CHTN, GDM -starting insulin Monday. Will see pt

## 2020-01-14 NOTE — Progress Notes (Signed)
I connected with  Kristin Lucas on 01/14/20 by a video enabled telemedicine application and verified that I am speaking with the correct person using two identifiers.   I discussed the limitations of evaluation and management by telemedicine. The patient expressed understanding and agreed to proceed.  MyChart ROB.  BS AM 250/270/285 after meals 250/270/285

## 2020-01-14 NOTE — Progress Notes (Signed)
   TELEHEALTH VIRTUAL OBSTETRICS VISIT ENCOUNTER NOTE  I connected with Kristin Lucas on 01/14/20 at  1:45 PM EDT by telephone at home and verified that I am speaking with the correct person using two identifiers.   I discussed the limitations, risks, security and privacy concerns of performing an evaluation and management service by telephone and the availability of in person appointments. I also discussed with the patient that there may be a patient responsible charge related to this service. The patient expressed understanding and agreed to proceed.  Subjective:  Kristin Lucas is a 39 y.o. G2P1001 at [redacted]w[redacted]d being followed for ongoing prenatal care.  She is currently monitored for the following issues for this high-risk pregnancy and has Supervision of high risk pregnancy, antepartum; Chronic hypertension affecting pregnancy; History of cesarean delivery affecting pregnancy; Family history of breast cancer; Elevated hemoglobin A1c; Gestational diabetes mellitus (GDM) affecting pregnancy, antepartum; and Abnormal glucose tolerance test (GTT) during pregnancy, antepartum on their problem list.  Patient reports no complaints and denies HA, RUQ pain, vision changes. . Reports fetal movement. Denies any contractions, bleeding or leaking of fluid.   The following portions of the patient's history were reviewed and updated as appropriate: allergies, current medications, past family history, past medical history, past social history, past surgical history and problem list.   Objective:   General:  Alert, oriented and cooperative.   Mental Status: Normal mood and affect perceived. Normal judgment and thought content.  Rest of physical exam deferred due to type of encounter  Assessment and Plan:  Pregnancy: G2P1001 at [redacted]w[redacted]d 1. Gestational diabetes mellitus (GDM) affecting pregnancy, antepartum Meets w/ CDE for initiation of insulin due to elevated blood glucose 200s range postprandial.   2. Chronic  hypertension affecting pregnancy Stable asx no meds  3. Supervision of high risk pregnancy, antepartum Antenatal testing starts at 32 weeks  4. Previous cesarean delivery, antepartum condition or complication Scheduled for repeat CS/BTL  Preterm labor symptoms and general obstetric precautions including but not limited to vaginal bleeding, contractions, leaking of fluid and fetal movement were reviewed in detail with the patient.  I discussed the assessment and treatment plan with the patient. The patient was provided an opportunity to ask questions and all were answered. The patient agreed with the plan and demonstrated an understanding of the instructions. The patient was advised to call back or seek an in-person office evaluation/go to MAU at [facility] for any urgent or concerning symptoms. Please refer to After Visit Summary for other counseling recommendations.   I provided 12 minutes of non-face-to-face time during this encounter.  Return in 1 week (on 01/21/2020).  Future Appointments  Date Time Provider Department Center  01/15/2020  3:00 PM WH-MFC NURSE [facility]-MFC MFC-US  01/15/2020  3:00 PM WH-MFC [place] WH-MFCUS MFC-US  01/22/2020  3:00 PM WH-MFC NURSE [facility]-M[facility] MFC-US  01/22/2020  3:00 PM WH-MFC [place] 3 WH-MFCUS MFC-US  01/29/2020  3:00 PM WH-MFC NURSE WH-MFC MFC-US  01/29/2020  3:00 PM WH-MFC [place] 3 WH-MFCUS MFC-US  02/08/2020  3:00 PM WH-MFC NURSE WH-MFC MFC-US  02/08/2020  3:00 PM WH-MFC [place] 3 WH-MFCUS MFC-US    Malachy Chamber, MD Center for [facility], [facility]   Patient ID: Kristin Lucas, female   DOB: Jun 07, 1981, 39 y.o.   MRN: [id number]

## 2020-01-14 NOTE — Patient Instructions (Signed)
Gestational Diabetes Mellitus, Diagnosis Gestational diabetes (gestational diabetes mellitus) is a temporary form of diabetes that some women develop during pregnancy. It usually occurs around weeks 24-28 of pregnancy, and it goes away after delivery. Hormonal changes during pregnancy can interfere with insulin production and function, which may result in one or both of these problems:  The pancreas does not make enough of a hormone called insulin.  Cells in the body do not respond properly to insulin that the body makes (insulin resistance). Normally, insulin allows blood sugar (glucose) to enter cells in the body. The cells use glucose for energy. Insulin resistance or lack of insulin causes excess glucose to build up in the blood instead of going into cells. As a result, high blood glucose (hyperglycemia) develops. If gestational diabetes is treated, it is not likely to cause problems. If it is not controlled with treatment, it may cause problems during labor and delivery, and some of those problems can be harmful to the unborn baby (fetus) and the mother. Women who get gestational diabetes are more likely to develop it if they get pregnant again, and they are more likely to develop type 2 diabetes in the future. What increases the risk? This condition may be more likely to develop in pregnant women who:  Are older than age 25 during pregnancy.  Have a family history of diabetes.  Are overweight.  Had gestational diabetes in the past.  Have polycystic ovary syndrome (PCOS).  Are pregnant with twins or multiples.  Are of American-Indian, African-American, Hispanic/Latino, or Asian/Pacific Islander descent. What are the signs or symptoms? Most women do not notice symptoms of gestational diabetes because the symptoms are similar to normal symptoms of pregnancy. Symptoms of gestational diabetes may include:  Increased thirst (polydipsia).  Increased hunger(polyphagia).  Increased  urination (polyuria). How is this diagnosed? This condition may be diagnosed based on your blood glucose level, which may be checked with one or more of the following blood tests:  A fasting blood glucose (FBG) test. You will not be allowed to eat (you will fast) for 8 hours or longer before a blood sample is taken.  A random blood glucose test. This checks your blood glucose at any time of day regardless of when you ate.  An oral glucose tolerance test (OGTT). This is usually done during weeks 24-28 of pregnancy. ? For this test, you will have an FBG test done. Then, you will drink a beverage that contains glucose. Your blood glucose will be tested again one hour after you drink the glucose beverage (1-hour OGTT). ? If the 1-hour OGTT result is at or above 140 mg/dL (7.8 mmol/L), you will repeat the OGTT. This time, your blood glucose will be tested 3 hours after you drink the glucose beverage (3-hour OGTT). If you have risk factors, you may be screened for undiagnosed type 2 diabetes at your first health care visit during your pregnancy (prenatal visit). How is this treated?     Your treatment may be managed by a specialist called an endocrinologist. This condition is treated by following instructions from your health care provider about:  Eating a healthy diet and getting more physical activity. These changes are the most important ways to manage gestational diabetes.  Checking your blood glucose. Do this as often as told.  Taking diabetes medicines or insulin every day. These will only be prescribed if they are needed. ? If you use insulin, you may need to adjust your dosage based on how physically active   you are and what foods you eat. Your health care provider will tell you how to do this. Your health care provider will set treatment goals for you based on the stage of your pregnancy and any other medical conditions you have. Generally, the goal of treatment is to maintain the following  blood glucose levels during pregnancy:  Before meals (preprandial): at or below 95 mg/dL (5.3 mmol/L).  After meals (postprandial): ? One hour after a meal: at or below 140 mg/dL (7.8 mmol/L). ? Two hours after a meal: at or below 120 mg/dL (6.7 mmol/L).  A1c (hemoglobin A1c) level: 6-6.5%. Follow these instructions at home: Questions to ask your health care provider  Consider asking the following questions: ? Do I need to meet with a diabetes educator? ? What equipment will I need to manage my diabetes at home? ? What diabetes medicines do I need, and when should I take them? ? How often do I need to check my blood glucose? ? What number can I call if I have questions? ? When is my next appointment? General instructions  Take over-the-counter and prescription medicines only as told by your health care provider.  Manage your weight gain during pregnancy. The amount of weight that you are expected to gain depends on your pre-pregnancy BMI (body mass index).  Keep all follow-up visits as told by your health care provider. This is important.  For more information about diabetes, visit: ? American Diabetes Association (ADA): www.diabetes.org ? American Association of Diabetes Educators (AADE): www.diabeteseducator.org Contact a health care provider if:  Your blood glucose level is at or above 240 mg/dL (13.3 mmol/L).  Your blood glucose level is at or above 200 mg/dL (11.1 mmol/L) and you have ketones in your urine.  You have been sick or have had a fever for 2 days or longer and you are not getting better.  You have any of the following problems for more than 6 hours: ? You cannot eat or drink. ? You have nausea and vomiting. ? You have diarrhea. Get help right away if:  Your blood glucose is lower than 54 mg/dL (3 mmol/L).  You become confused or you have trouble thinking clearly.  You have difficulty breathing.  You have moderate or large ketone levels in your  urine.  Your baby is moving around less than usual.  You develop unusual discharge or bleeding from your vagina.  You start having contractions early (prematurely). Contractions may feel like a tightening in your lower abdomen. Summary  Gestational diabetes (gestational diabetes mellitus) is a temporary form of diabetes that some women develop during pregnancy. It usually occurs around weeks 24-28 of pregnancy, and it goes away after delivery.  This condition is treated by making diet and lifestyle changes and taking diabetes medicines or insulin, if needed.  Women who get gestational diabetes are more likely to develop it if they get pregnant again, and they are more likely to develop type 2 diabetes in the future. This information is not intended to replace advice given to you by your health care provider. Make sure you discuss any questions you have with your health care provider. Document Revised: 10/31/2017 Document Reviewed: 10/28/2015 Elsevier Patient Education  2020 Elsevier Inc.  

## 2020-01-14 NOTE — MAU Note (Signed)
BAby is usually very active. Baby has not moved like usual and tonight I could not get her to move at all. Did not want to take any chances that something was wrong so I came in. Denies LOF or VB

## 2020-01-14 NOTE — Discharge Instructions (Signed)
Fetal Movement Counts Patient Name: ________________________________________________ Patient Due Date: ____________________ What is a fetal movement count?  A fetal movement count is the number of times that you feel your baby move during a certain amount of time. This may also be called a fetal kick count. A fetal movement count is recommended for every pregnant woman. You may be asked to start counting fetal movements as early as week 28 of your pregnancy. Pay attention to when your baby is most active. You may notice your baby's sleep and wake cycles. You may also notice things that make your baby move more. You should do a fetal movement count:  When your baby is normally most active.  At the same time each day. A good time to count movements is while you are resting, after having something to eat and drink. How do I count fetal movements? 1. Find a quiet, comfortable area. Sit, or lie down on your side. 2. Write down the date, the start time and stop time, and the number of movements that you felt between those two times. Take this information with you to your health care visits. 3. Write down your start time when you feel the first movement. 4. Count kicks, flutters, swishes, rolls, and jabs. You should feel at least 10 movements. 5. You may stop counting after you have felt 10 movements, or if you have been counting for 2 hours. Write down the stop time. 6. If you do not feel 10 movements in 2 hours, contact your health care provider for further instructions. Your health care provider may want to do additional tests to assess your baby's well-being. Contact a health care provider if:  You feel fewer than 10 movements in 2 hours.  Your baby is not moving like he or she usually does. Date: ____________ Start time: ____________ Stop time: ____________ Movements: ____________ Date: ____________ Start time: ____________ Stop time: ____________ Movements: ____________ Date: ____________  Start time: ____________ Stop time: ____________ Movements: ____________ Date: ____________ Start time: ____________ Stop time: ____________ Movements: ____________ Date: ____________ Start time: ____________ Stop time: ____________ Movements: ____________ Date: ____________ Start time: ____________ Stop time: ____________ Movements: ____________ Date: ____________ Start time: ____________ Stop time: ____________ Movements: ____________ Date: ____________ Start time: ____________ Stop time: ____________ Movements: ____________ Date: ____________ Start time: ____________ Stop time: ____________ Movements: ____________ This information is not intended to replace advice given to you by your health care provider. Make sure you discuss any questions you have with your health care provider. Document Revised: 05/14/2019 Document Reviewed: 05/14/2019 Elsevier Patient Education  2020 Elsevier Inc.  

## 2020-01-14 NOTE — Progress Notes (Signed)
Written and verbal d/c instructions given and understanding voiced. Kick count reviewed and pt voiced understanding

## 2020-01-14 NOTE — Progress Notes (Signed)
Baby moving when EFM applied and pt feels FM

## 2020-01-15 ENCOUNTER — Ambulatory Visit (HOSPITAL_COMMUNITY): Payer: Medicaid Other | Admitting: *Deleted

## 2020-01-15 ENCOUNTER — Encounter (HOSPITAL_COMMUNITY): Payer: Self-pay

## 2020-01-15 ENCOUNTER — Ambulatory Visit (HOSPITAL_COMMUNITY)
Admission: RE | Admit: 2020-01-15 | Discharge: 2020-01-15 | Disposition: A | Discharge status: Home / Self Care | Payer: Medicaid Other | Source: Ambulatory Visit | Attending: Obstetrics and Gynecology | Admitting: Obstetrics and Gynecology

## 2020-01-15 DIAGNOSIS — O09523 Supervision of elderly multigravida, third trimester: Secondary | ICD-10-CM

## 2020-01-15 DIAGNOSIS — O34219 Maternal care for unspecified type scar from previous cesarean delivery: Secondary | ICD-10-CM

## 2020-01-15 DIAGNOSIS — O24415 Gestational diabetes mellitus in pregnancy, controlled by oral hypoglycemic drugs: Secondary | ICD-10-CM | POA: Diagnosis not present

## 2020-01-15 DIAGNOSIS — O10919 Unspecified pre-existing hypertension complicating pregnancy, unspecified trimester: Secondary | ICD-10-CM

## 2020-01-15 DIAGNOSIS — O0990 Supervision of high risk pregnancy, unspecified, unspecified trimester: Secondary | ICD-10-CM | POA: Diagnosis present

## 2020-01-15 DIAGNOSIS — O10019 Pre-existing essential hypertension complicating pregnancy, unspecified trimester: Secondary | ICD-10-CM

## 2020-01-15 DIAGNOSIS — O99213 Obesity complicating pregnancy, third trimester: Secondary | ICD-10-CM

## 2020-01-15 DIAGNOSIS — Z362 Encounter for other antenatal screening follow-up: Secondary | ICD-10-CM | POA: Diagnosis not present

## 2020-01-15 DIAGNOSIS — O24419 Gestational diabetes mellitus in pregnancy, unspecified control: Secondary | ICD-10-CM

## 2020-01-15 DIAGNOSIS — Z3A32 32 weeks gestation of pregnancy: Secondary | ICD-10-CM

## 2020-01-18 ENCOUNTER — Encounter: Payer: Self-pay | Admitting: *Deleted

## 2020-01-18 ENCOUNTER — Other Ambulatory Visit: Payer: Self-pay | Admitting: Family Medicine

## 2020-01-18 MED ORDER — INSULIN ASPART 100 UNIT/ML ~~LOC~~ SOLN: SUBCUTANEOUS | 12 refills | Status: DC

## 2020-01-20 ENCOUNTER — Ambulatory Visit (HOSPITAL_COMMUNITY): Payer: Medicaid Other | Attending: Obstetrics | Admitting: *Deleted

## 2020-01-20 ENCOUNTER — Ambulatory Visit (HOSPITAL_COMMUNITY): Payer: Medicaid Other | Admitting: *Deleted

## 2020-01-20 ENCOUNTER — Other Ambulatory Visit: Payer: Self-pay

## 2020-01-20 ENCOUNTER — Telehealth (INDEPENDENT_AMBULATORY_CARE_PROVIDER_SITE_OTHER): Payer: Medicaid Other | Admitting: Obstetrics & Gynecology

## 2020-01-20 ENCOUNTER — Encounter (HOSPITAL_COMMUNITY): Payer: Self-pay

## 2020-01-20 VITALS — Temp 98.2°F

## 2020-01-20 DIAGNOSIS — O10919 Unspecified pre-existing hypertension complicating pregnancy, unspecified trimester: Secondary | ICD-10-CM

## 2020-01-20 DIAGNOSIS — O0990 Supervision of high risk pregnancy, unspecified, unspecified trimester: Secondary | ICD-10-CM

## 2020-01-20 DIAGNOSIS — Z3689 Encounter for other specified antenatal screening: Secondary | ICD-10-CM | POA: Diagnosis not present

## 2020-01-20 DIAGNOSIS — O24419 Gestational diabetes mellitus in pregnancy, unspecified control: Secondary | ICD-10-CM

## 2020-01-20 DIAGNOSIS — Z794 Long term (current) use of insulin: Secondary | ICD-10-CM | POA: Diagnosis not present

## 2020-01-20 DIAGNOSIS — O24414 Gestational diabetes mellitus in pregnancy, insulin controlled: Secondary | ICD-10-CM

## 2020-01-20 DIAGNOSIS — Z3A33 33 weeks gestation of pregnancy: Secondary | ICD-10-CM

## 2020-01-20 DIAGNOSIS — O10913 Unspecified pre-existing hypertension complicating pregnancy, third trimester: Secondary | ICD-10-CM

## 2020-01-20 DIAGNOSIS — O0993 Supervision of high risk pregnancy, unspecified, third trimester: Secondary | ICD-10-CM

## 2020-01-20 NOTE — Patient Instructions (Signed)
Insulin Pumps  An insulin pump is a small, computerized, battery-powered device that steadily (continuously) delivers insulin to the body. Insulin pumps may be used to treat type 1 diabetes or type 2 diabetes. They may be used with rapid-acting insulin or short-acting insulin. An insulin pump has a container (cartridge or reservoir) of insulin that must be refilled regularly. The cartridge is connected to a needle that is placed into the tissue between skin and muscle in any of these areas:  Abdomen. Avoid any area that is less than 2 inches (5 cm) from the belly button (navel).  Front of thigh.  Upper, outer side of thigh.  Upper, outer part of arm.  Upper, outer part of buttock. The area where the needle is inserted is called the infusion site. The needle is surrounded by a small plastic tube (cannula). Together, the needle and cannula are referred to as the infusion set. After the infusion set is inserted under the skin, the needle is removed and the cannula stays in place to deliver insulin to the body. What does an insulin pump do? Insulin pumps deliver insulin to the body in the following ways:  Basal rate. This method gives small, continuous amounts of insulin 24 hours a day.  Bolus dose. This is a specific amount of insulin that you can give yourself right away. You may need this after eating a meal or when you have high blood sugar (glucose). You will be instructed on the amount to give depending on your blood glucose level reading. What are some advantages of using an insulin pump? The advantages of using an insulin pump vary depending on which type of diabetes you have. Advantages may include:  Reduced need to give yourself multiple insulin injections. With an insulin pump, you need to insert a needle into your body one time every 2-3 days instead of every time that you need insulin.  More control over your diabetes and more flexibility for scheduling meals, snacks, and  exercise.  Reduced risk of low blood glucose (hypoglycemia).  Better management of the increases in blood glucose in the early morning (dawn phenomenon).  More precise insulin doses, which may mean an improvement in blood glucose levels.  A more predictable insulin absorption rate.  Easier delivery of basal rate insulin. What are some disadvantages of using an insulin pump?  An insulin pump and its supplies might cost more than the supplies for injections.  You may need to check your blood glucose level more often than when you give yourself injections.  You have the pump attached to you wherever you go, for 24 hours a day. What are the risks?  The infusion site can get irritated or infected. To avoid those risks: ? Care for your skin. ? Change the infusion set as directed by your health care provider. ? Periodically move your infusion site to a slightly different area.  Pump failure. This can be caused by a malfunction, such as a blockage in the tubing, the needle moving out of place, or the pump running out of insulin.  Most pumps have an alarm to warn you of a malfunction. However, if the pump fails and you are unaware that it has failed, you may develop diabetic ketoacidosis. This is a life-threatening complication of diabetes that can occur due to lack of insulin.  Blockage (occlusion). This can prevent your pump from delivering insulin properly. This can be caused by a bent cannula or a kink in the tubing. What may cause high   blood glucose when using an insulin pump? Possible causes of high blood glucose (hyperglycemia) when using an insulin pump include:  Tubing that breaks, leaks, bends, or moves out of place.  Low charge in a battery.  Infection at the infusion site. Signs of infection may include: ? Redness, swelling, or pain. ? Blood or cloudy fluid. ? Warmth. ? Pus or a bad smell.  A cartridge that is empty or incorrectly loaded into the pump.  A basal rate  that needs to be adjusted.  Air bubbles in the tubing.  Illness or stress.  Changes in hormone levels, such as during a menstrual cycle.  The pump delivering the wrong amount of insulin.  An interruption in insulin delivery.  Poor absorption at the infusion site, even if you recently moved the site.  Using ineffective insulin. Insulin can become ineffective if it is outdated or exposed to extreme heat or cold.  A change in your normal activity level. How to care for your insulin pump  Refill the insulin cartridge as often as needed. Do not let the cartridge get completely empty.  Always keep extra pump supplies, syringes, and insulin with you.  Store your insulin according to instructions on the packaging.  If you have questions or a problem that you cannot solve, call your health care provider or call the pump manufacturer's customer service line. How to manage your diabetes while using an insulin pump   Check your blood glucose 4 or more times a day, or as often as directed.  Always check your blood glucose at the following times: ? Before you change your basal or bolus rates. ? After you change your infusion set.  Check your A1c (hemoglobin A1c) level as often as directed.  Continue to manage your diabetes as directed. This includes: ? Exercising regularly. ? Eating healthy foods. ? Managing your weight.  If you give yourself a correction (supplemental) insulin dose and your blood glucose stays high, check your urine for ketones. A supplemental dose is a specific amount of rapid-acting or short-acting insulin that is used to lower blood glucose if it is too high. You may be instructed to check your blood glucose at certain times of the day and to use corrective insulin as needed. ? If you have negative ketones, check your pump to see if it is working properly. If your pump does not seem to be working properly, change your cartridge, infusion set, and insertion site.  Recheck your blood glucose in 1 hour. If your blood glucose is still high, give yourself an additional supplemental insulin dose with an injection using a syringe and needle. ? If you have moderate or large ketones in your urine, give yourself an additional supplemental insulin dose with an injection using a syringe and needle. Contact your health care provider for additional instructions. Contact a health care provider if:  You have any of the following at your infusion site: ? Redness, swelling, or pain. ? Blood or cloudy fluid. ? Warmth. ? Pus or a bad smell. ? A red streak on your skin that leads away from the infusion site.  You have a fever.  You have moderate or large ketone levels in your urine.  Your pump is not working properly.  Your blood glucose is higher or lower than the target range that your health care provider gave you. Summary  An insulin pump is a small, computerized, battery-powered device that steadily (continuously) delivers insulin to the body.  Insulin pumps may be   used to treat type 1 diabetes (type 1 diabetes mellitus) or type 2 diabetes (type 2 diabetes mellitus).  After the infusion set is inserted under the skin, the needle is removed and the cannula stays in place to deliver insulin to the body.  Always check your blood glucose before you change your basal or bolus rates, and after you change your infusion set.  Refill the insulin cartridge as often as needed. Do not let the cartridge get completely empty. Always keep extra pump supplies, syringes, and insulin with you. This information is not intended to replace advice given to you by your health care provider. Make sure you discuss any questions you have with your health care provider. Document Revised: 09/27/2017 Document Reviewed: 10/28/2015 Elsevier Patient Education  2020 [employer]  

## 2020-01-20 NOTE — Progress Notes (Signed)
Pt requesting Dr. Parke Poisson to make insulin adjustments. Pt in with Dr. Parke Poisson for adjustment of insulin.

## 2020-01-20 NOTE — Progress Notes (Signed)
   TELEHEALTH VIRTUAL OBSTETRICS VISIT ENCOUNTER NOTE  I connected with Kristin Lucas on 01/20/20 at  1:45 PM EDT by telephone at home and verified that I am speaking with the correct person using two identifiers.   I discussed the limitations, risks, security and privacy concerns of performing an evaluation and management service by telephone and the availability of in person appointments. I also discussed with the patient that there may be a patient responsible charge related to this service. The patient expressed understanding and agreed to proceed.  Subjective:  Kristin Lucas is a 39 y.o. G2P1001 at [redacted]w[redacted]d being followed for ongoing prenatal care.  She is currently monitored for the following issues for this high-risk pregnancy and has Supervision of high risk pregnancy, antepartum; Chronic hypertension affecting pregnancy; History of cesarean delivery affecting pregnancy; Family history of breast cancer; Elevated hemoglobin A1c; Gestational diabetes mellitus (GDM) affecting pregnancy, antepartum; and Abnormal glucose tolerance test (GTT) during pregnancy, antepartum on their problem list.  Patient reports no complaints. Reports fetal movement. Denies any contractions, bleeding or leaking of fluid.   The following portions of the patient's history were reviewed and updated as appropriate: allergies, current medications, past family history, past medical history, past social history, past surgical history and problem list.   Objective:   General:  Alert, oriented and cooperative.   Mental Status: Normal mood and affect perceived. Normal judgment and thought content.  Rest of physical exam deferred due to type of encounter  Assessment and Plan:  Pregnancy: G2P1001 at [redacted]w[redacted]d 1. Gestational diabetes mellitus (GDM) affecting pregnancy, antepartum FBS 120 and pp 150's  2. Chronic hypertension affecting pregnancy Continue labetalol  3. Supervision of high risk pregnancy, antepartum Has MFM appt  today  Preterm labor symptoms and general obstetric precautions including but not limited to vaginal bleeding, contractions, leaking of fluid and fetal movement were reviewed in detail with the patient.  I discussed the assessment and treatment plan with the patient. The patient was provided an opportunity to ask questions and all were answered. The patient agreed with the plan and demonstrated an understanding of the instructions. The patient was advised to call back or seek an in-person office evaluation/go to MAU at [facility] for any urgent or concerning symptoms. Please refer to After Visit Summary for other counseling recommendations.   I provided 12 minutes of non-face-to-face time during this encounter.  Return in about 1 week (around 01/27/2020) for virtual.  Future Appointments  Date Time Provider Department Center  01/20/2020  2:15 PM [facility] NST [facility]  01/20/2020  2:30 PM [facility] NURSE [facility]  01/29/2020  3:00 PM [facility] NURSE [facility]  01/29/2020  3:00 PM [facility] [place] 3 [facility]  02/08/2020  3:00 PM [facility] NURSE [facility]  02/08/2020  3:00 PM [facility] [place] 3 [facility]    Scheryl Darter, MD [facility], [facility]

## 2020-01-20 NOTE — Procedures (Signed)
Kristin Lucas 1980-10-23 [redacted]w[redacted]d  Fetus A Non-Stress Test Interpretation for 01/20/20  Indication: Gestational Diabetes medication controlled  Fetal Heart Rate A Mode: External Baseline Rate (A): 150 bpm Variability: Moderate Accelerations: 15 x 15 Decelerations: None Multiple birth?: No  Uterine Activity Mode: Toco Contraction Frequency (min): irreg UC noted Contraction Duration (sec): 60-80 Contraction Quality: Mild Resting Tone Palpated: Relaxed Resting Time: Adequate  Interpretation (Fetal Testing) Nonstress Test Interpretation: Reactive Comments: FHR tracing rev'd by Dr. Parke Poisson

## 2020-01-20 NOTE — Progress Notes (Signed)
Pt is on the phone preparing for virtual visit with provider. [redacted]w[redacted]d. Fasting BG level this morning of 121. Pt has appt for NST and BPP this afternoon.

## 2020-01-22 ENCOUNTER — Ambulatory Visit (HOSPITAL_COMMUNITY): Payer: Medicaid Other

## 2020-01-26 ENCOUNTER — Telehealth (INDEPENDENT_AMBULATORY_CARE_PROVIDER_SITE_OTHER): Payer: Medicaid Other | Admitting: Obstetrics and Gynecology

## 2020-01-26 NOTE — Progress Notes (Signed)
Unable to reach patient for virtual appointment 

## 2020-01-27 ENCOUNTER — Encounter: Payer: Self-pay | Admitting: Obstetrics and Gynecology

## 2020-01-27 ENCOUNTER — Telehealth (INDEPENDENT_AMBULATORY_CARE_PROVIDER_SITE_OTHER): Payer: Medicaid Other | Admitting: Obstetrics and Gynecology

## 2020-01-27 VITALS — BP 141/90

## 2020-01-27 DIAGNOSIS — O0993 Supervision of high risk pregnancy, unspecified, third trimester: Secondary | ICD-10-CM

## 2020-01-27 DIAGNOSIS — O24419 Gestational diabetes mellitus in pregnancy, unspecified control: Secondary | ICD-10-CM

## 2020-01-27 DIAGNOSIS — O10919 Unspecified pre-existing hypertension complicating pregnancy, unspecified trimester: Secondary | ICD-10-CM

## 2020-01-27 DIAGNOSIS — Z3A34 34 weeks gestation of pregnancy: Secondary | ICD-10-CM

## 2020-01-27 DIAGNOSIS — O34219 Maternal care for unspecified type scar from previous cesarean delivery: Secondary | ICD-10-CM

## 2020-01-27 DIAGNOSIS — O0990 Supervision of high risk pregnancy, unspecified, unspecified trimester: Secondary | ICD-10-CM

## 2020-01-27 DIAGNOSIS — Z3009 Encounter for other general counseling and advice on contraception: Secondary | ICD-10-CM

## 2020-01-27 NOTE — Progress Notes (Signed)
OBSTETRICS PRENATAL VIRTUAL VISIT ENCOUNTER NOTE  Provider [facility]: [facility]   I connected with Kristin Lucas on 01/27/20 at  2:45 PM EDT by MyChart Video Encounter at home and verified that I am speaking with the correct person using two identifiers.   I discussed the limitations, risks, security and privacy concerns of performing an evaluation and management service virtually and the availability of in person appointments. I also discussed with the patient that there may be a patient responsible charge related to this service. The patient expressed understanding and agreed to proceed. Subjective:  Kristin Lucas is a 39 y.o. G2P1001 at 8251w0d being seen today for ongoing prenatal care.  She is currently monitored for the following issues for this high-risk pregnancy and has Supervision of high risk pregnancy, antepartum; Chronic hypertension affecting pregnancy; History of cesarean delivery affecting pregnancy; Family history of breast cancer; Elevated hemoglobin A1c; Gestational diabetes mellitus (GDM) affecting pregnancy, antepartum; and Unwanted fertility on their problem list.  Patient reports no complaints.  Contractions: Not present. Vag. Bleeding: None.  Movement: Present. Denies any leaking of fluid.   The following portions of the patient's history were reviewed and updated as appropriate: allergies, current medications, past family history, past medical history, past social history, past surgical history and problem list.   Objective:   Vitals:   01/27/20 1438  BP: (!) 141/90    Fetal Status:     Movement: Present     General:  Alert, oriented and cooperative. Patient is in no acute distress.  Respiratory: Normal respiratory effort, no problems with respiration noted  Mental Status: Normal mood and affect. Normal behavior. Normal judgment and thought content.  Rest of physical exam deferred due to type of encounter  Imaging: US MFM FETAL BPP WO  NON STRESS  Result Date: 01/15/2020 ----------------------------------------------------------------------  OBSTETRICS REPORT                       (Signed Final 01/15/2020 03:51 pm) ---------------------------------------------------------------------- Patient Info  ID #:       [id number]                          D.O.B.:  August 30, 1981 (39 yrs)  Name:       Kristin Lucas                    Visit Date: 01/15/2020 03:39 pm ---------------------------------------------------------------------- Performed By  Performed By:     Eden Lathearrie Stalter BS      Ref. Address:     [address]                    [address]                                                             [address]                                                             [phone]  [place]                                                             [address]  Attending:        Ma Rings MD         [facility]  Referred By:      [facility] ---------------------------------------------------------------------- Orders   #  Description                          Code         Ordered By   1  [place] MFM FETAL BPP WO NON              76819.01     RAVI [facility]      STRESS  ----------------------------------------------------------------------   #  Order #                    Accession #                 Episode #   1  [id number]                  [id number]                  [id number]  ---------------------------------------------------------------------- Indications   Hypertension - Chronic/Pre-existing (poor      O10.019   control - insulin prescription ready but not   picked up)   Encounter for other antenatal screening        Z36.2   follow-up   Gestational diabetes in pregnancy,             O24.415   controlled by oral hypoglycemic drugs   (metformin)   Advanced maternal age multigravida 56+,        O69.523   third  trimester   Obesity complicating pregnancy, third          O99.213   trimester   Previous cesarean delivery, antepartum x 1     O34.219   [redacted] weeks gestation of pregnancy                Z3A.32  ---------------------------------------------------------------------- Fetal Evaluation  Num Of Fetuses:         1  Fetal Heart Rate(bpm):  157  Cardiac Activity:       Observed  Presentation:           Cephalic  Amniotic Fluid  AFI FV:      Within normal limits  AFI Sum(cm)     %Tile       Largest Pocket(cm)  17.66           65          5.12  RUQ(cm)       RLQ(cm)       LUQ(cm)  LLQ(cm)  3.89          4.57          4.08           5.12 ---------------------------------------------------------------------- Biophysical Evaluation  Amniotic F.V:   Within normal limits       F. Tone:        Observed  F. Movement:    Observed                   Score:          8/8  F. Breathing:   Observed ---------------------------------------------------------------------- OB History  Gravidity:    2         Term:   1        Prem:   0        SAB:   0  TOP:          0       Ectopic:  0        Living: 1 ---------------------------------------------------------------------- Gestational Age  LMP:           32w 2d        Date:  06/03/19                 EDD:   03/09/20  Best:          Milderd Meager 2d     Det. By:  LMP  (06/03/19)          EDD:   03/09/20 ---------------------------------------------------------------------- Comments  This patient was seen for a biophysical profile due to a history  of chronic hypertension that is currently treated with labetalol.  She reports that she was also recently diagnosed with  gestational diabetes and will be started on insulin treatment  next week.  A biophysical profile performed today was 8 out of 8.  There was normal amniotic fluid noted on today's ultrasound  exam.  Another biophysical profile was scheduled in 1 week. ----------------------------------------------------------------------                    Johnell Comings, MD Electronically Signed Final Report   01/15/2020 03:51 pm ----------------------------------------------------------------------  [place] MFM OB FOLLOW UP  Result Date: 01/07/2020 ----------------------------------------------------------------------  OBSTETRICS REPORT                       (Signed Final 01/07/2020 03:58 pm) ---------------------------------------------------------------------- Patient Info  ID #:       [id number]                          D.O.B.:  1981/07/14 (39 yrs)  Name:       Kristin Lucas                    Visit Date: 01/07/2020 02:04 pm ---------------------------------------------------------------------- Performed By  Performed By:     Novella Rob        Ref. Address:     [address]  [address]                                                             [place]                                                             [address]  Attending:        Noralee Space MD        [facility]  Referred By:      [facility] ---------------------------------------------------------------------- Orders   #  Description                          Code         Ordered By   1  [place] MFM OB FOLLOW UP                  [phone]     YU FANG  ----------------------------------------------------------------------   #  Order #                    Accession #                 Episode #   1  [id number]                  [id number]                  [id number]  ---------------------------------------------------------------------- Indications   Hypertension - Chronic/Pre-existing (poor      O10.019   control - insulin prescription ready but not   picked up)   Gestational diabetes in pregnancy, diet        O24.410   controlled   Encounter for other antenatal screening         Z36.2   follow-up   Advanced maternal age multigravida 34+,        O46.522   second trimester (low risk NIPS, 3.2FF)   Obesity complicating pregnancy, second         O99.212   trimester   Previous cesarean delivery, antepartum x 1     O34.219   [redacted] weeks gestation of pregnancy                Z3A.31  ---------------------------------------------------------------------- Fetal Evaluation  Num Of Fetuses:         1  Fetal Heart Rate(bpm):  150  Cardiac Activity:       Observed  Presentation:           Cephalic  Placenta:               Posterior  P. Cord  Insertion:      Previously Visualized  Amniotic Fluid  AFI FV:      Within normal limits  AFI Sum(cm)     %Tile       Largest Pocket(cm)  18.93           72          5.47  RUQ(cm)       RLQ(cm)       LUQ(cm)        LLQ(cm)  5.47          5.12          4.73           3.61 ---------------------------------------------------------------------- Biometry  BPD:      73.1  mm     G. Age:  29w 2d          4  %    CI:        74.56   %    70 - 86                                                          FL/HC:      21.5   %    19.3 - 21.3  HC:      268.7  mm     G. Age:  29w 2d        < 1  %    HC/AC:      0.92        0.96 - 1.17  AC:      291.6  mm     G. Age:  33w 1d         93  %    FL/BPD:     78.9   %    71 - 87  FL:       57.7  mm     G. Age:  30w 1d         14  %    FL/AC:      19.8   %    20 - 24  Est. FW:    1789  gm    3 lb 15 oz      52  % ---------------------------------------------------------------------- OB History  Gravidity:    2         Term:   1        Prem:   0        SAB:   0  TOP:          0       Ectopic:  0        Living: 1 ---------------------------------------------------------------------- Gestational Age  LMP:           31w 1d        Date:  06/03/19                 EDD:   03/09/20  U/S Today:     30w 3d                                        EDD:   03/14/20  Best:  31w 1d     Det. By:  LMP  (06/03/19)          EDD:   03/09/20  ---------------------------------------------------------------------- Anatomy  Cranium:               Appears normal         Aortic Arch:            Previously seen  Cavum:                 Appears normal         Ductal Arch:            Previously seen  Ventricles:            Appears normal         Diaphragm:              Appears normal  Choroid Plexus:        Previously seen        Stomach:                Appears normal, left                                                                        sided  Cerebellum:            Previously seen        Abdomen:                Appears normal  Posterior Fossa:       Previously seen        Abdominal Wall:         Previously seen  Nuchal Fold:           Not applicable (>20    Cord Vessels:           Previously seen                         wks GA)  Face:                  Orbits and profile     Kidneys:                Appear normal                         previously seen  Lips:                  Previously seen        Bladder:                Appears normal  Thoracic:              Appears normal         Spine:                  Previously seen  Heart:                 Previously seen        Upper Extremities:      Previously seen  RVOT:  Previously seen        Lower Extremities:      Previously seen  LVOT:                  Previously seen ---------------------------------------------------------------------- Cervix Uterus Adnexa  Cervix  Not visualized (advanced GA >24wks) ---------------------------------------------------------------------- Impression  Chronic hypertension.  Labetalol dosage was recently  increased to 200 mg twice daily.  Patient also has gestational  diabetes and will be starting insulin because of poor control.  Amniotic fluid is normal and good fetal activity is seen. Fetal  growth is appropriate for gestational age.  BP at our office today is 136/71 mmHg. ---------------------------------------------------------------------- Recommendations   -Weekly BPP starting at [redacted] weeks gestation till delivery. ----------------------------------------------------------------------                  Noralee Space, MD Electronically Signed Final Report   01/07/2020 03:58 pm ----------------------------------------------------------------------   Assessment and Plan:  Pregnancy: G2P1001 at [redacted]w[redacted]d 1. Gestational diabetes mellitus (GDM) affecting pregnancy, antepartum CBG's still elevated on current settings of Omnipod. Instructed to speak with DM educator tomorrow to make additional adjustments. Has not been taking Metformin the last week For simplicity will keep pt off Metformin and adjust Omnipod to achieve CBG goals. Continue with weekly antenatal testing, next appt this Friday  2. Chronic hypertension affecting pregnancy BP stable Continue with Labetalol and qd BASA MFM appt this Friday as noted above  3. Supervision of high risk pregnancy, antepartum Stable GBS next appt  4. History of cesarean delivery affecting pregnancy For repeat with BTL  5. Unwanted fertility Papers signed  Preterm labor symptoms and general obstetric precautions including but not limited to vaginal bleeding, contractions, leaking of fluid and fetal movement were reviewed in detail with the patient. I discussed the assessment and treatment plan with the patient. The patient was provided an opportunity to ask questions and all were answered. The patient agreed with the plan and demonstrated an understanding of the instructions. The patient was advised to call back or seek an in-person office evaluation/go to [facility] at [facility] for any urgent or concerning symptoms. Please refer to After Visit Summary for other counseling recommendations.   I provided 8 minutes of face-to-face time during this encounter.  Return in about 2 weeks (around 02/10/2020) for OB visit, face to face, MD provider.  Future Appointments  Date Time Provider Department Center    01/29/2020  3:00 PM [facility] NURSE [facility]  01/29/2020  3:00 PM [facility] [place] 3 [facility]-US  02/08/2020  3:00 PM [facility] NURSE [facility]  02/08/2020  3:00 PM [facility] [place] 3 [facility]-M[facility]US M[facility]-US  02/11/2020  1:30 PM Adam Phenix, MD [facility] None    Hermina Staggers, MD Center for [facility]

## 2020-01-29 ENCOUNTER — Encounter (HOSPITAL_COMMUNITY): Payer: Self-pay

## 2020-01-29 ENCOUNTER — Ambulatory Visit (HOSPITAL_COMMUNITY)
Admission: RE | Admit: 2020-01-29 | Discharge: 2020-01-29 | Disposition: A | Discharge status: Home / Self Care | Payer: Medicaid Other | Source: Ambulatory Visit | Attending: Obstetrics and Gynecology | Admitting: Obstetrics and Gynecology

## 2020-01-29 ENCOUNTER — Ambulatory Visit (HOSPITAL_COMMUNITY): Payer: Medicaid Other | Admitting: *Deleted

## 2020-01-29 ENCOUNTER — Other Ambulatory Visit: Payer: Self-pay

## 2020-01-29 DIAGNOSIS — O10919 Unspecified pre-existing hypertension complicating pregnancy, unspecified trimester: Secondary | ICD-10-CM | POA: Diagnosis present

## 2020-01-29 DIAGNOSIS — O24415 Gestational diabetes mellitus in pregnancy, controlled by oral hypoglycemic drugs: Secondary | ICD-10-CM

## 2020-01-29 DIAGNOSIS — Z362 Encounter for other antenatal screening follow-up: Secondary | ICD-10-CM | POA: Diagnosis not present

## 2020-01-29 DIAGNOSIS — O24419 Gestational diabetes mellitus in pregnancy, unspecified control: Secondary | ICD-10-CM | POA: Insufficient documentation

## 2020-01-29 DIAGNOSIS — E669 Obesity, unspecified: Secondary | ICD-10-CM

## 2020-01-29 DIAGNOSIS — O10013 Pre-existing essential hypertension complicating pregnancy, third trimester: Secondary | ICD-10-CM

## 2020-01-29 DIAGNOSIS — O0990 Supervision of high risk pregnancy, unspecified, unspecified trimester: Secondary | ICD-10-CM | POA: Insufficient documentation

## 2020-01-29 DIAGNOSIS — O09523 Supervision of elderly multigravida, third trimester: Secondary | ICD-10-CM | POA: Diagnosis not present

## 2020-01-29 DIAGNOSIS — O99213 Obesity complicating pregnancy, third trimester: Secondary | ICD-10-CM

## 2020-01-29 DIAGNOSIS — O34219 Maternal care for unspecified type scar from previous cesarean delivery: Secondary | ICD-10-CM

## 2020-01-29 DIAGNOSIS — Z3A34 34 weeks gestation of pregnancy: Secondary | ICD-10-CM

## 2020-02-01 ENCOUNTER — Other Ambulatory Visit (HOSPITAL_COMMUNITY): Payer: Self-pay | Admitting: *Deleted

## 2020-02-01 ENCOUNTER — Telehealth: Payer: Self-pay | Admitting: Registered"

## 2020-02-01 DIAGNOSIS — O24414 Gestational diabetes mellitus in pregnancy, insulin controlled: Secondary | ICD-10-CM

## 2020-02-01 NOTE — Telephone Encounter (Signed)
RD contacted Cristal Deer, Sr Clinical Services Manager for Omnipod, for update on Omnipod adjustments:   01/20/20 increased from 2.2 to 2.4 units/hr  bolus of 11.5 units for meals and 5.5 units for snacks.  01/31/20 increased to 2.6 units/hr with bolus unchanged.

## 2020-02-05 ENCOUNTER — Telehealth: Payer: Self-pay | Admitting: Registered"

## 2020-02-05 NOTE — Telephone Encounter (Signed)
Kristin Lucas, Sr Clinical Services Mgr for Omnipod, increased [name]'s delivery rate to 2.8u/hr on 02/05/20 due to FBS 105, 110, 106 mg/dL. Bolus unchanged

## 2020-02-08 ENCOUNTER — Encounter: Payer: Self-pay | Admitting: *Deleted

## 2020-02-08 ENCOUNTER — Other Ambulatory Visit: Payer: Self-pay

## 2020-02-08 ENCOUNTER — Ambulatory Visit (HOSPITAL_COMMUNITY): Payer: Medicaid Other | Attending: Obstetrics and Gynecology

## 2020-02-08 ENCOUNTER — Ambulatory Visit: Payer: Medicaid Other | Admitting: *Deleted

## 2020-02-08 DIAGNOSIS — O09523 Supervision of elderly multigravida, third trimester: Secondary | ICD-10-CM

## 2020-02-08 DIAGNOSIS — O24419 Gestational diabetes mellitus in pregnancy, unspecified control: Secondary | ICD-10-CM

## 2020-02-08 DIAGNOSIS — Z362 Encounter for other antenatal screening follow-up: Secondary | ICD-10-CM

## 2020-02-08 DIAGNOSIS — Z3A35 35 weeks gestation of pregnancy: Secondary | ICD-10-CM

## 2020-02-08 DIAGNOSIS — O322XX0 Maternal care for transverse and oblique lie, not applicable or unspecified: Secondary | ICD-10-CM

## 2020-02-08 DIAGNOSIS — O24414 Gestational diabetes mellitus in pregnancy, insulin controlled: Secondary | ICD-10-CM | POA: Diagnosis not present

## 2020-02-08 DIAGNOSIS — O34219 Maternal care for unspecified type scar from previous cesarean delivery: Secondary | ICD-10-CM

## 2020-02-08 DIAGNOSIS — O10919 Unspecified pre-existing hypertension complicating pregnancy, unspecified trimester: Secondary | ICD-10-CM

## 2020-02-08 DIAGNOSIS — O24415 Gestational diabetes mellitus in pregnancy, controlled by oral hypoglycemic drugs: Secondary | ICD-10-CM | POA: Diagnosis not present

## 2020-02-08 DIAGNOSIS — Z3A00 Weeks of gestation of pregnancy not specified: Secondary | ICD-10-CM | POA: Diagnosis not present

## 2020-02-08 DIAGNOSIS — O10013 Pre-existing essential hypertension complicating pregnancy, third trimester: Secondary | ICD-10-CM | POA: Diagnosis not present

## 2020-02-08 DIAGNOSIS — Z794 Long term (current) use of insulin: Secondary | ICD-10-CM

## 2020-02-08 DIAGNOSIS — Z9641 Presence of insulin pump (external) (internal): Secondary | ICD-10-CM

## 2020-02-08 DIAGNOSIS — O0990 Supervision of high risk pregnancy, unspecified, unspecified trimester: Secondary | ICD-10-CM

## 2020-02-09 ENCOUNTER — Other Ambulatory Visit: Payer: Self-pay | Admitting: *Deleted

## 2020-02-09 DIAGNOSIS — O24414 Gestational diabetes mellitus in pregnancy, insulin controlled: Secondary | ICD-10-CM

## 2020-02-11 ENCOUNTER — Other Ambulatory Visit: Payer: Self-pay

## 2020-02-11 ENCOUNTER — Other Ambulatory Visit (HOSPITAL_COMMUNITY)
Admission: RE | Admit: 2020-02-11 | Discharge: 2020-02-11 | Disposition: A | Discharge status: Home / Self Care | Payer: Medicaid Other | Source: Ambulatory Visit | Attending: Obstetrics & Gynecology | Admitting: Obstetrics & Gynecology

## 2020-02-11 ENCOUNTER — Ambulatory Visit (INDEPENDENT_AMBULATORY_CARE_PROVIDER_SITE_OTHER): Payer: Medicaid Other | Admitting: Obstetrics & Gynecology

## 2020-02-11 VITALS — BP 134/90 | HR 105 | Wt 254.0 lb

## 2020-02-11 DIAGNOSIS — Z3009 Encounter for other general counseling and advice on contraception: Secondary | ICD-10-CM

## 2020-02-11 DIAGNOSIS — O24419 Gestational diabetes mellitus in pregnancy, unspecified control: Secondary | ICD-10-CM

## 2020-02-11 DIAGNOSIS — O10919 Unspecified pre-existing hypertension complicating pregnancy, unspecified trimester: Secondary | ICD-10-CM

## 2020-02-11 DIAGNOSIS — O34219 Maternal care for unspecified type scar from previous cesarean delivery: Secondary | ICD-10-CM

## 2020-02-11 DIAGNOSIS — O0990 Supervision of high risk pregnancy, unspecified, unspecified trimester: Secondary | ICD-10-CM

## 2020-02-11 NOTE — Patient Instructions (Signed)
Cesarean Delivery Cesarean birth, or cesarean delivery, is the surgical delivery of a baby through an incision in the abdomen and the uterus. This may be referred to as a C-section. This procedure may be scheduled ahead of time, or it may be done in an emergency situation. Tell a health care provider about:  Any allergies you have.  All medicines you are taking, including vitamins, herbs, eye drops, creams, and over-the-counter medicines.  Any problems you or family members have had with anesthetic medicines.  Any blood disorders you have.  Any surgeries you have had.  Any medical conditions you have.  Whether you or any members of your family have a history of deep vein thrombosis (DVT) or pulmonary embolism (PE). What are the risks? Generally, this is a safe procedure. However, problems may occur, including:  Infection.  Bleeding.  Allergic reactions to medicines.  Damage to other structures or organs.  Blood clots.  Injury to your baby. What happens before the procedure? General instructions  Follow instructions from your health care provider about eating or drinking restrictions.  If you know that you are going to have a cesarean delivery, do not shave your pubic area. Shaving before the procedure may increase your risk of infection.  Plan to have someone take you home from the hospital.  Ask your health care provider what steps will be taken to prevent infection. These may include: ? Removing hair at the surgery site. ? Washing skin with a germ-killing soap. ? Taking antibiotic medicine.  Depending on the reason for your cesarean delivery, you may have a physical exam or additional testing, such as an ultrasound.  You may have your blood or urine tested. Questions for your health care provider  Ask your health care provider about: ? Changing or stopping your regular medicines. This is especially important if you are taking diabetes medicines or blood  thinners. ? Your pain management plan. This is especially important if you plan to breastfeed your baby. ? How long you will be in the hospital after the procedure. ? Any concerns you may have about receiving blood products, if you need them during the procedure. ? Cord blood banking, if you plan to collect your baby's umbilical cord blood.  You may also want to ask your health care provider: ? Whether you will be able to hold or breastfeed your baby while you are still in the operating room. ? Whether your baby can stay with you immediately after the procedure and during your recovery. ? Whether a family member or a person of your choice can go with you into the operating room and stay with you during the procedure, immediately after the procedure, and during your recovery. What happens during the procedure?   An IV will be inserted into one of your veins.  Fluid and medicines, such as antibiotics, will be given before the surgery.  Fetal monitors will be placed on your abdomen to check your baby's heart rate.  You may be given a special warming gown to wear to keep your temperature stable.  A catheter may be inserted into your bladder through your urethra. This drains your urine during the procedure.  You may be given one or more of the following: ? A medicine to numb the area (local anesthetic). ? A medicine to make you fall asleep (general anesthetic). ? A medicine (regional anesthetic) that is injected into your back or through a small thin tube placed in your back (spinal anesthetic or epidural anesthetic).   This numbs everything below the injection site and allows you to stay awake during your procedure. If this makes you feel nauseous, tell your health care provider. Medicines will be available to help reduce any nausea you may feel.  An incision will be made in your abdomen, and then in your uterus.  If you are awake during your procedure, you may feel tugging and pulling in  your abdomen, but you should not feel pain. If you feel pain, tell your health care provider immediately.  Your baby will be removed from your uterus. You may feel more pressure or pushing while this happens.  Immediately after birth, your baby will be dried and kept warm. You may be able to hold and breastfeed your baby.  The umbilical cord may be clamped and cut during this time. This usually occurs after waiting a period of 1-2 minutes after delivery.  Your placenta will be removed from your uterus.  Your incisions will be closed with stitches (sutures). Staples, skin glue, or adhesive strips may also be applied to the incision in your abdomen.  Bandages (dressings) may be placed over the incision in your abdomen. The procedure may vary among health care providers and hospitals. What happens after the procedure?  Your blood pressure, heart rate, breathing rate, and blood oxygen level will be monitored until you are discharged from the hospital.  You may continue to receive fluids and medicines through an IV.  You will have some pain. Medicines will be available to help control your pain.  To help prevent blood clots: ? You may be given medicines. ? You may have to wear compression stockings or devices. ? You will be encouraged to walk around when you are able.  Hospital staff will encourage and support bonding with your baby. Your hospital may have you and your baby to stay in the same room (rooming in) during your hospital stay to encourage successful bonding and breastfeeding.  You may be encouraged to cough and breathe deeply often. This helps to prevent lung problems.  If you have a catheter draining your urine, it will be removed as soon as possible after your procedure. Summary  Cesarean birth, or cesarean delivery, is the surgical delivery of a baby through an incision in the abdomen and the uterus.  Follow instructions from your health care provider about eating or  drinking restrictions before the procedure.  You will have some pain after the procedure. Medicines will be available to help control your pain.  Hospital staff will encourage and support bonding with your baby after the procedure. Your hospital may have you and your baby to stay in the same room (rooming in) during your hospital stay to encourage successful bonding and breastfeeding. This information is not intended to replace advice given to you by your health care provider. Make sure you discuss any questions you have with your health care provider. Document Revised: 03/31/2018 Document Reviewed: 03/31/2018 Elsevier Patient Education  2020 Elsevier Inc.  

## 2020-02-11 NOTE — Progress Notes (Signed)
   PRENATAL VISIT NOTE  Subjective:  Kristin Lucas is a 39 y.o. G2P1001 at [redacted]w[redacted]d being seen today for ongoing prenatal care.  She is currently monitored for the following issues for this high-risk pregnancy and has Supervision of high risk pregnancy, antepartum; Chronic hypertension affecting pregnancy; History of cesarean delivery affecting pregnancy; Family history of breast cancer; Elevated hemoglobin A1c; Gestational diabetes mellitus (GDM) affecting pregnancy, antepartum; and Unwanted fertility on their problem list.  Patient reports presssure.  Contractions: Not present. Vag. Bleeding: None.  Movement: Present. Denies leaking of fluid.   The following portions of the patient's history were reviewed and updated as appropriate: allergies, current medications, past family history, past medical history, past social history, past surgical history and problem list.   Objective:   Vitals:   02/11/20 1338  BP: 134/90  Pulse: (!) 105  Weight: 254 lb (115.2 kg)    Fetal Status: Fetal Heart Rate (bpm): 158   Movement: Present     General:  Alert, oriented and cooperative. Patient is in no acute distress.  Skin: Skin is warm and dry. No rash noted.   Cardiovascular: Normal heart rate noted  Respiratory: Normal respiratory effort, no problems with respiration noted  Abdomen: Soft, gravid, appropriate for gestational age.  Pain/Pressure: Present     Pelvic: Cervical exam deferred        Extremities: Normal range of motion.  Edema: Trace  Mental Status: Normal mood and affect. Normal behavior. Normal judgment and thought content.   Assessment and Plan:  Pregnancy: G2P1001 at [redacted]w[redacted]d There are no diagnoses linked to this encounter. Preterm labor symptoms and general obstetric precautions including but not limited to vaginal bleeding, contractions, leaking of fluid and fetal movement were reviewed in detail with the patient. Please refer to After Visit Summary for other counseling  recommendations.  Unwanted fertility  Chronic hypertension affecting pregnancy  Gestational diabetes mellitus (GDM) affecting pregnancy, antepartum  History of cesarean delivery affecting pregnancy  Diabetic control has been suboptimal until the last few days. MFM recommendation reviewed and due to control problems I rescheduled her delivery to 38 weeks on 5/19 Return in about 1 week (around 02/18/2020) for virtual ok.  Future Appointments  Date Time Provider Department Center  02/16/2020  3:15 PM [facility] NURSE [facility]  02/16/2020  3:15 PM [facility] US2 [facility]  02/17/2020  2:15 PM Conan Bowens, MD [facility] None  02/22/2020  2:30 PM [facility] US3 [facility]    Scheryl Darter, MD

## 2020-02-12 LAB — CERVICOVAGINAL ANCILLARY ONLY
Bacterial Vaginitis (gardnerella): NEGATIVE
Candida Glabrata: NEGATIVE
Candida Vaginitis: POSITIVE — AB
Chlamydia: NEGATIVE
Comment: NEGATIVE
Comment: NEGATIVE
Comment: NEGATIVE
Comment: NEGATIVE
Comment: NEGATIVE
Comment: NORMAL
Neisseria Gonorrhea: NEGATIVE
Trichomonas: NEGATIVE

## 2020-02-14 ENCOUNTER — Other Ambulatory Visit: Payer: Self-pay | Admitting: Obstetrics & Gynecology

## 2020-02-14 DIAGNOSIS — O0990 Supervision of high risk pregnancy, unspecified, unspecified trimester: Secondary | ICD-10-CM

## 2020-02-14 MED ORDER — FLUCONAZOLE 150 MG PO TABS: 150.0000 mg | ORAL_TABLET | Freq: Once | ORAL | 0 refills | Status: AC

## 2020-02-15 ENCOUNTER — Encounter (HOSPITAL_COMMUNITY): Payer: Self-pay

## 2020-02-15 ENCOUNTER — Other Ambulatory Visit: Payer: Self-pay | Admitting: Family Medicine

## 2020-02-15 LAB — CULTURE, BETA STREP (GROUP B ONLY): Strep Gp B Culture: NEGATIVE

## 2020-02-15 NOTE — Patient Instructions (Addendum)
Kristin Lucas  02/15/2020   Your procedure is scheduled on:  02/24/2020  Arrive at 1030 at Entrance C on [employer] at [facility]  and [employer]. You are invited to use the FREE valet parking or use the Visitor's parking deck.  Pick up the phone at the desk and dial [phone].  Call this number if you have problems the morning of surgery: [phone]  Remember:   Do not eat food:(After Midnight) Desps de medianoche.  Do not drink clear liquids: (After Midnight) Desps de medianoche.  Take these medicines the morning of surgery with A SIP OF WATER:  Take your labetalol as prescribed. Stop your insulin pump the night before surgery when you go to bed. Stop it as close to midnight as possible.     Do not wear jewelry, make-up or nail polish.  Do not wear lotions, powders, or perfumes. Do not wear deodorant.  Do not shave 48 hours prior to surgery.  Do not bring valuables to the hospital.  [facility] is not   responsible for any belongings or valuables brought to the hospital.  Contacts, dentures or bridgework may not be worn into surgery.  Leave suitcase in the car. After surgery it may be brought to your room.  For patients admitted to the hospital, checkout time is 11:00 AM the day of              discharge.      Please read over the following fact sheets that you were given:     Preparing for Surgery

## 2020-02-16 ENCOUNTER — Ambulatory Visit (HOSPITAL_COMMUNITY): Payer: Medicaid Other | Attending: Obstetrics and Gynecology

## 2020-02-16 ENCOUNTER — Other Ambulatory Visit: Payer: Self-pay | Admitting: Obstetrics and Gynecology

## 2020-02-16 ENCOUNTER — Ambulatory Visit: Payer: Medicaid Other | Admitting: *Deleted

## 2020-02-16 ENCOUNTER — Other Ambulatory Visit: Payer: Self-pay

## 2020-02-16 ENCOUNTER — Encounter: Payer: Self-pay | Admitting: *Deleted

## 2020-02-16 DIAGNOSIS — O24415 Gestational diabetes mellitus in pregnancy, controlled by oral hypoglycemic drugs: Secondary | ICD-10-CM | POA: Insufficient documentation

## 2020-02-16 DIAGNOSIS — O24434 Gestational diabetes mellitus in the puerperium, insulin controlled: Secondary | ICD-10-CM | POA: Diagnosis not present

## 2020-02-16 DIAGNOSIS — O24419 Gestational diabetes mellitus in pregnancy, unspecified control: Secondary | ICD-10-CM | POA: Insufficient documentation

## 2020-02-16 DIAGNOSIS — O0990 Supervision of high risk pregnancy, unspecified, unspecified trimester: Secondary | ICD-10-CM | POA: Diagnosis present

## 2020-02-16 DIAGNOSIS — Z3A36 36 weeks gestation of pregnancy: Secondary | ICD-10-CM

## 2020-02-16 DIAGNOSIS — O10013 Pre-existing essential hypertension complicating pregnancy, third trimester: Secondary | ICD-10-CM | POA: Diagnosis not present

## 2020-02-16 DIAGNOSIS — O10919 Unspecified pre-existing hypertension complicating pregnancy, unspecified trimester: Secondary | ICD-10-CM | POA: Diagnosis present

## 2020-02-16 NOTE — Progress Notes (Signed)
Patient denies floaters, spots or blurred vision, but states her vision seems "a little off." Description was very vague.

## 2020-02-17 ENCOUNTER — Telehealth (INDEPENDENT_AMBULATORY_CARE_PROVIDER_SITE_OTHER): Payer: Medicaid Other | Admitting: Obstetrics and Gynecology

## 2020-02-17 ENCOUNTER — Encounter: Payer: Self-pay | Admitting: Obstetrics and Gynecology

## 2020-02-17 VITALS — BP 141/89 | HR 90 | Wt 235.0 lb

## 2020-02-17 DIAGNOSIS — O0990 Supervision of high risk pregnancy, unspecified, unspecified trimester: Secondary | ICD-10-CM

## 2020-02-17 DIAGNOSIS — O34219 Maternal care for unspecified type scar from previous cesarean delivery: Secondary | ICD-10-CM

## 2020-02-17 DIAGNOSIS — Z3009 Encounter for other general counseling and advice on contraception: Secondary | ICD-10-CM

## 2020-02-17 DIAGNOSIS — O0993 Supervision of high risk pregnancy, unspecified, third trimester: Secondary | ICD-10-CM

## 2020-02-17 DIAGNOSIS — O10919 Unspecified pre-existing hypertension complicating pregnancy, unspecified trimester: Secondary | ICD-10-CM

## 2020-02-17 DIAGNOSIS — Z3A37 37 weeks gestation of pregnancy: Secondary | ICD-10-CM

## 2020-02-17 DIAGNOSIS — O24419 Gestational diabetes mellitus in pregnancy, unspecified control: Secondary | ICD-10-CM

## 2020-02-17 DIAGNOSIS — O10913 Unspecified pre-existing hypertension complicating pregnancy, third trimester: Secondary | ICD-10-CM

## 2020-02-17 NOTE — Progress Notes (Signed)
   OBSTETRICS PRENATAL VIRTUAL VISIT ENCOUNTER NOTE  Provider location: Center for [facility] [place]   I connected with Kristin Lucas on 02/17/20 at  2:15 PM EDT by MyChart Video Encounter at home and verified that I am speaking with the correct person using two identifiers.   I discussed the limitations, risks, security and privacy concerns of performing an evaluation and management service virtually and the availability of in person appointments. I also discussed with the patient that there may be a patient responsible charge related to this service. The patient expressed understanding and agreed to proceed. Subjective:  Kristin Lucas is a 39 y.o. G2P1001 at [redacted]w[redacted]d being seen today for ongoing prenatal care.  She is currently monitored for the following issues for this high-risk pregnancy and has Supervision of high risk pregnancy, antepartum; Chronic hypertension affecting pregnancy; History of cesarean delivery affecting pregnancy; Family history of breast cancer; Elevated hemoglobin A1c; Gestational diabetes mellitus (GDM) affecting pregnancy, antepartum; and Unwanted fertility on their problem list.  Patient reports no complaints.  Contractions: Not present. Vag. Bleeding: None.  Movement: Present. Denies any leaking of fluid.   The following portions of the patient's history were reviewed and updated as appropriate: allergies, current medications, past family history, past medical history, past social history, past surgical history and problem list.   Objective:   Vitals:   02/17/20 1408  BP: (!) 141/89  Pulse: 90  Weight: 235 lb (106.6 kg)    Fetal Status:     Movement: Present     General:  Alert, oriented and cooperative. Patient is in no acute distress.  Respiratory: Normal respiratory effort, no problems with respiration noted  Mental Status: Normal mood and affect. Normal behavior. Normal judgment and thought content.  Rest of physical exam deferred due to type of  encounter  Imaging:   Assessment and Plan:  Pregnancy: G2P1001 at [redacted]w[redacted]d  1. Gestational diabetes mellitus (GDM) affecting pregnancy, antepartum On insulin FG: 91-97  2. Chronic hypertension affecting pregnancy BP stable, on labetalol  3. Supervision of high risk pregnancy, antepartum  4. Unwanted fertility For BTL  5. History of cesarean delivery affecting pregnancy For RCS, scheduled for 5/19  Term labor symptoms and general obstetric precautions including but not limited to vaginal bleeding, contractions, leaking of fluid and fetal movement were reviewed in detail with the patient. I discussed the assessment and treatment plan with the patient. The patient was provided an opportunity to ask questions and all were answered. The patient agreed with the plan and demonstrated an understanding of the instructions. The patient was advised to call back or seek an in-person office evaluation/go to MAU at [facility] for any urgent or concerning symptoms. Please refer to After Visit Summary for other counseling recommendations.   I provided 12 minutes of face-to-face time during this encounter.  Return in about 5 weeks (around 03/23/2020) for post partum check.  Future Appointments  Date Time Provider Department Center  02/22/2020  8:40 AM [facility] 1 [facility] None  02/22/2020  2:30 PM [facility] US3 [facility]    Conan Bowens, MD Center for [facility], [facility]

## 2020-02-17 NOTE — Progress Notes (Signed)
I connected with  Kristin Lucas on 02/17/20 by a video enabled telemedicine application and verified that I am speaking with the correct person using two identifiers.   I discussed the limitations of evaluation and management by telemedicine. The patient expressed understanding and agreed to proceed.   MyChart OB, reports no problems today.

## 2020-02-22 ENCOUNTER — Other Ambulatory Visit: Payer: Self-pay

## 2020-02-22 ENCOUNTER — Other Ambulatory Visit
Admission: RE | Admit: 2020-02-22 | Discharge: 2020-02-22 | Disposition: A | Discharge status: Home / Self Care | Payer: Medicaid Other | Source: Ambulatory Visit | Attending: Obstetrics and Gynecology | Admitting: Obstetrics and Gynecology

## 2020-02-22 ENCOUNTER — Ambulatory Visit (HOSPITAL_BASED_OUTPATIENT_CLINIC_OR_DEPARTMENT_OTHER): Payer: Medicaid Other

## 2020-02-22 ENCOUNTER — Ambulatory Visit: Payer: Medicaid Other | Admitting: *Deleted

## 2020-02-22 ENCOUNTER — Encounter: Payer: Self-pay | Admitting: *Deleted

## 2020-02-22 DIAGNOSIS — Z3A37 37 weeks gestation of pregnancy: Secondary | ICD-10-CM | POA: Diagnosis not present

## 2020-02-22 DIAGNOSIS — O24414 Gestational diabetes mellitus in pregnancy, insulin controlled: Secondary | ICD-10-CM

## 2020-02-22 DIAGNOSIS — Z3A00 Weeks of gestation of pregnancy not specified: Secondary | ICD-10-CM | POA: Diagnosis not present

## 2020-02-22 DIAGNOSIS — O10013 Pre-existing essential hypertension complicating pregnancy, third trimester: Secondary | ICD-10-CM | POA: Diagnosis not present

## 2020-02-22 DIAGNOSIS — O0990 Supervision of high risk pregnancy, unspecified, unspecified trimester: Secondary | ICD-10-CM

## 2020-02-22 DIAGNOSIS — O10919 Unspecified pre-existing hypertension complicating pregnancy, unspecified trimester: Secondary | ICD-10-CM

## 2020-02-22 DIAGNOSIS — O24419 Gestational diabetes mellitus in pregnancy, unspecified control: Secondary | ICD-10-CM

## 2020-02-22 LAB — CBC
HCT: 31.1 % — ABNORMAL LOW (ref 36.0–46.0)
Hemoglobin: 9.8 g/dL — ABNORMAL LOW (ref 12.0–15.0)
MCH: 26.3 pg (ref 26.0–34.0)
MCHC: 31.5 g/dL (ref 30.0–36.0)
MCV: 83.6 fL (ref 80.0–100.0)
Platelets: 259 10*3/uL (ref 150–400)
RBC: 3.72 MIL/uL — ABNORMAL LOW (ref 3.87–5.11)
RDW: 13.8 % (ref 11.5–15.5)
WBC: 10.9 10*3/uL — ABNORMAL HIGH (ref 4.0–10.5)
nRBC: 0.2 % (ref 0.0–0.2)

## 2020-02-22 LAB — TYPE AND SCREEN
ABO/RH(D): O POS
Antibody Screen: NEGATIVE

## 2020-02-22 LAB — RAPID HIV SCREEN (HIV 1/2 AB+AG)
HIV 1/2 Antibodies: NONREACTIVE
HIV-1 P24 Antigen - HIV24: NONREACTIVE

## 2020-02-22 LAB — RPR: RPR Ser Ql: NONREACTIVE

## 2020-02-22 LAB — ABO/RH: ABO/RH(D): O POS

## 2020-02-22 NOTE — Progress Notes (Signed)
States she has a mild headache. States she took tylenol prior to leaving her house. States HA usually relieved by Tylenol so she is not concerned.

## 2020-02-22 NOTE — MAU Note (Signed)
Pt here for PAT lab draw. Do not need to reswab for covid today- denies any new symptoms.

## 2020-02-23 ENCOUNTER — Encounter: Payer: Self-pay | Admitting: Family Medicine

## 2020-02-23 ENCOUNTER — Encounter (HOSPITAL_COMMUNITY): Payer: Self-pay | Admitting: Obstetrics and Gynecology

## 2020-02-23 DIAGNOSIS — O99019 Anemia complicating pregnancy, unspecified trimester: Secondary | ICD-10-CM | POA: Insufficient documentation

## 2020-02-23 NOTE — Anesthesia Preprocedure Evaluation (Addendum)
Anesthesia Evaluation  Patient identified by MRN, date of birth, ID band Patient awake    Reviewed: Allergy & Precautions, NPO status , Patient's Chart, lab work & pertinent test results, reviewed documented beta blocker date and time   Airway Mallampati: II  TM Distance: >3 FB Neck ROM: Full    Dental no notable dental hx. (+) Teeth Intact   Pulmonary neg pulmonary ROS,    Pulmonary exam normal breath sounds clear to auscultation       Cardiovascular hypertension, Pt. on medications and Pt. on home beta blockers Normal cardiovascular exam Rhythm:Regular Rate:Normal     Neuro/Psych  Headaches, negative psych ROS   GI/Hepatic Neg liver ROS, GERD  ,  Endo/Other  diabetes, Well Controlled, Gestational, Insulin Dependent, Oral Hypoglycemic AgentsObesity  Renal/GU   negative genitourinary   Musculoskeletal negative musculoskeletal ROS (+)   Abdominal (+) + obese,   Peds  Hematology  (+) anemia ,   Anesthesia Other Findings   Reproductive/Obstetrics (+) Pregnancy Previous C/Section Undesired fertility                            Anesthesia Physical Anesthesia Plan  ASA: III  Anesthesia Plan: Spinal   Post-op Pain Management:    Induction:   PONV Risk Score and Plan: 4 or greater and Scopolamine patch - Pre-op, Ondansetron and Treatment may vary due to age or medical condition  Airway Management Planned: Natural Airway  Additional Equipment:   Intra-op Plan:   Post-operative Plan:   Informed Consent: I have reviewed the patients History and Physical, chart, labs and discussed the procedure including the risks, benefits and alternatives for the proposed anesthesia with the patient or authorized representative who has indicated his/her understanding and acceptance.     Dental advisory given  Plan Discussed with: CRNA and Surgeon  Anesthesia Plan Comments:        Anesthesia  Quick Evaluation

## 2020-02-24 ENCOUNTER — Inpatient Hospital Stay (HOSPITAL_COMMUNITY)
Admission: RE | Admit: 2020-02-24 | Discharge: 2020-02-26 | DRG: 784 | Disposition: A | Discharge status: Home / Self Care | Payer: Medicaid Other | Attending: Obstetrics and Gynecology | Admitting: Obstetrics and Gynecology

## 2020-02-24 ENCOUNTER — Inpatient Hospital Stay (HOSPITAL_COMMUNITY): Payer: Medicaid Other | Admitting: Anesthesiology

## 2020-02-24 ENCOUNTER — Encounter (HOSPITAL_COMMUNITY): Payer: Self-pay | Admitting: Obstetrics and Gynecology

## 2020-02-24 ENCOUNTER — Encounter (HOSPITAL_COMMUNITY)
Admission: RE | Disposition: A | Discharge status: Home / Self Care | Payer: Self-pay | Source: Home / Self Care | Attending: Obstetrics and Gynecology

## 2020-02-24 DIAGNOSIS — O134 Gestational [pregnancy-induced] hypertension without significant proteinuria, complicating childbirth: Secondary | ICD-10-CM

## 2020-02-24 DIAGNOSIS — O1002 Pre-existing essential hypertension complicating childbirth: Secondary | ICD-10-CM | POA: Diagnosis present

## 2020-02-24 DIAGNOSIS — O34211 Maternal care for low transverse scar from previous cesarean delivery: Secondary | ICD-10-CM | POA: Diagnosis present

## 2020-02-24 DIAGNOSIS — O10919 Unspecified pre-existing hypertension complicating pregnancy, unspecified trimester: Secondary | ICD-10-CM | POA: Diagnosis present

## 2020-02-24 DIAGNOSIS — E669 Obesity, unspecified: Secondary | ICD-10-CM | POA: Diagnosis present

## 2020-02-24 DIAGNOSIS — R7309 Other abnormal glucose: Secondary | ICD-10-CM | POA: Diagnosis present

## 2020-02-24 DIAGNOSIS — O0990 Supervision of high risk pregnancy, unspecified, unspecified trimester: Secondary | ICD-10-CM

## 2020-02-24 DIAGNOSIS — O24424 Gestational diabetes mellitus in childbirth, insulin controlled: Secondary | ICD-10-CM | POA: Diagnosis present

## 2020-02-24 DIAGNOSIS — O34219 Maternal care for unspecified type scar from previous cesarean delivery: Secondary | ICD-10-CM | POA: Diagnosis present

## 2020-02-24 DIAGNOSIS — Z3009 Encounter for other general counseling and advice on contraception: Secondary | ICD-10-CM | POA: Diagnosis present

## 2020-02-24 DIAGNOSIS — Z98891 History of uterine scar from previous surgery: Secondary | ICD-10-CM

## 2020-02-24 DIAGNOSIS — O99214 Obesity complicating childbirth: Secondary | ICD-10-CM | POA: Diagnosis present

## 2020-02-24 DIAGNOSIS — Z302 Encounter for sterilization: Secondary | ICD-10-CM

## 2020-02-24 DIAGNOSIS — O24429 Gestational diabetes mellitus in childbirth, unspecified control: Secondary | ICD-10-CM

## 2020-02-24 DIAGNOSIS — Z3A38 38 weeks gestation of pregnancy: Secondary | ICD-10-CM

## 2020-02-24 DIAGNOSIS — D649 Anemia, unspecified: Secondary | ICD-10-CM | POA: Diagnosis present

## 2020-02-24 DIAGNOSIS — O9902 Anemia complicating childbirth: Secondary | ICD-10-CM | POA: Diagnosis present

## 2020-02-24 DIAGNOSIS — O99019 Anemia complicating pregnancy, unspecified trimester: Secondary | ICD-10-CM | POA: Diagnosis present

## 2020-02-24 DIAGNOSIS — O24419 Gestational diabetes mellitus in pregnancy, unspecified control: Secondary | ICD-10-CM | POA: Diagnosis present

## 2020-02-24 LAB — CBC
HCT: 30 % — ABNORMAL LOW (ref 36.0–46.0)
Hemoglobin: 9.4 g/dL — ABNORMAL LOW (ref 12.0–15.0)
MCH: 26.7 pg (ref 26.0–34.0)
MCHC: 31.3 g/dL (ref 30.0–36.0)
MCV: 85.2 fL (ref 80.0–100.0)
Platelets: 242 10*3/uL (ref 150–400)
RBC: 3.52 MIL/uL — ABNORMAL LOW (ref 3.87–5.11)
RDW: 13.9 % (ref 11.5–15.5)
WBC: 11.7 10*3/uL — ABNORMAL HIGH (ref 4.0–10.5)
nRBC: 0.3 % — ABNORMAL HIGH (ref 0.0–0.2)

## 2020-02-24 LAB — CREATININE, SERUM
Creatinine, Ser: 0.77 mg/dL (ref 0.44–1.00)
GFR calc Af Amer: >60 mL/min (ref >60)
GFR calc non Af Amer: >60 mL/min (ref >60)

## 2020-02-24 LAB — GLUCOSE, CAPILLARY
Glucose-Capillary: 121 mg/dL — ABNORMAL HIGH (ref 70–99)
Glucose-Capillary: 129 mg/dL — ABNORMAL HIGH (ref 70–99)

## 2020-02-24 SURGERY — Surgical Case
Anesthesia: Spinal

## 2020-02-24 MED ORDER — KETOROLAC TROMETHAMINE 30 MG/ML IJ SOLN
INTRAMUSCULAR | Status: AC
  Filled 2020-02-24: qty 1

## 2020-02-24 MED ORDER — SODIUM CHLORIDE 0.9 % IR SOLN
Status: DC | PRN
  Administered 2020-02-24: 1

## 2020-02-24 MED ORDER — ZOLPIDEM TARTRATE 5 MG PO TABS: 5.0000 mg | ORAL_TABLET | Freq: Every evening | ORAL | Status: DC | PRN

## 2020-02-24 MED ORDER — ONDANSETRON HCL 4 MG/2ML IJ SOLN: 4.0000 mg | Freq: Once | INTRAMUSCULAR | Status: DC | PRN

## 2020-02-24 MED ORDER — HYDROMORPHONE HCL 1 MG/ML IJ SOLN: 0.2500 mg | INTRAMUSCULAR | Status: DC | PRN

## 2020-02-24 MED ORDER — BUPIVACAINE IN DEXTROSE 0.75-8.25 % IT SOLN
INTRATHECAL | Status: DC | PRN
  Administered 2020-02-24: 1.8 mL via INTRATHECAL

## 2020-02-24 MED ORDER — METOCLOPRAMIDE HCL 5 MG/ML IJ SOLN
INTRAMUSCULAR | Status: DC | PRN
  Administered 2020-02-24: 10 mg via INTRAVENOUS

## 2020-02-24 MED ORDER — MORPHINE SULFATE (PF) 0.5 MG/ML IJ SOLN
INTRAMUSCULAR | Status: DC | PRN
  Administered 2020-02-24: 150 ug via INTRATHECAL

## 2020-02-24 MED ORDER — PHENYLEPHRINE HCL-NACL 20-0.9 MG/250ML-% IV SOLN
INTRAVENOUS | Status: DC | PRN
  Administered 2020-02-24: 20 ug/min via INTRAVENOUS

## 2020-02-24 MED ORDER — ONDANSETRON HCL 4 MG/2ML IJ SOLN
INTRAMUSCULAR | Status: AC
  Filled 2020-02-24: qty 2

## 2020-02-24 MED ORDER — MORPHINE SULFATE (PF) 0.5 MG/ML IJ SOLN
INTRAMUSCULAR | Status: AC
  Filled 2020-02-24: qty 10

## 2020-02-24 MED ORDER — CEFAZOLIN SODIUM-DEXTROSE 2-3 GM-%(50ML) IV SOLR
INTRAVENOUS | Status: DC | PRN
  Administered 2020-02-24: 2 g via INTRAVENOUS

## 2020-02-24 MED ORDER — PRENATAL MULTIVITAMIN CH
1.0000 | ORAL_TABLET | Freq: Every day | ORAL | Status: DC
  Administered 2020-02-25 – 2020-02-26 (×2): 1 via ORAL
  Filled 2020-02-24 (×2): qty 1

## 2020-02-24 MED ORDER — TETANUS-DIPHTH-ACELL PERTUSSIS 5-2.5-18.5 LF-MCG/0.5 IM SUSP: 0.5000 mL | Freq: Once | INTRAMUSCULAR | Status: DC

## 2020-02-24 MED ORDER — OXYCODONE HCL 5 MG PO TABS
5.0000 mg | ORAL_TABLET | ORAL | Status: DC | PRN
  Administered 2020-02-25 – 2020-02-26 (×3): 5 mg via ORAL
  Filled 2020-02-24 (×3): qty 1

## 2020-02-24 MED ORDER — SODIUM CHLORIDE 0.9 % IV SOLN: INTRAVENOUS | Status: DC | PRN

## 2020-02-24 MED ORDER — DEXAMETHASONE SODIUM PHOSPHATE 4 MG/ML IJ SOLN
INTRAMUSCULAR | Status: AC
  Filled 2020-02-24: qty 1

## 2020-02-24 MED ORDER — ONDANSETRON HCL 4 MG/2ML IJ SOLN
INTRAMUSCULAR | Status: DC | PRN
  Administered 2020-02-24: 4 mg via INTRAVENOUS

## 2020-02-24 MED ORDER — OXYTOCIN 10 UNIT/ML IJ SOLN
INTRAMUSCULAR | Status: DC | PRN
  Administered 2020-02-24: 40 [IU]

## 2020-02-24 MED ORDER — SIMETHICONE 80 MG PO CHEW: 80.0000 mg | CHEWABLE_TABLET | ORAL | Status: DC | PRN

## 2020-02-24 MED ORDER — FENTANYL CITRATE (PF) 100 MCG/2ML IJ SOLN
INTRAMUSCULAR | Status: AC
  Filled 2020-02-24: qty 2

## 2020-02-24 MED ORDER — OXYTOCIN 40 UNITS IN NORMAL SALINE INFUSION - SIMPLE MED: 2.5000 [IU]/h | INTRAVENOUS | Status: AC

## 2020-02-24 MED ORDER — STERILE WATER FOR IRRIGATION IR SOLN
Status: DC | PRN
  Administered 2020-02-24: 1000 mL

## 2020-02-24 MED ORDER — LACTATED RINGERS IV SOLN
120.0000 mL/h | INTRAVENOUS | Status: DC
  Administered 2020-02-24 (×2): 120 mL/h via INTRAVENOUS

## 2020-02-24 MED ORDER — LACTATED RINGERS IV BOLUS
1000.0000 mL | Freq: Once | INTRAVENOUS | Status: AC
  Administered 2020-02-24: 1000 mL via INTRAVENOUS

## 2020-02-24 MED ORDER — IBUPROFEN 600 MG PO TABS
600.0000 mg | ORAL_TABLET | Freq: Four times a day (QID) | ORAL | Status: DC
  Administered 2020-02-24 – 2020-02-26 (×7): 600 mg via ORAL
  Filled 2020-02-24 (×7): qty 1

## 2020-02-24 MED ORDER — CEFAZOLIN SODIUM-DEXTROSE 2-4 GM/100ML-% IV SOLN
INTRAVENOUS | Status: AC
  Filled 2020-02-24: qty 100

## 2020-02-24 MED ORDER — DIBUCAINE (PERIANAL) 1 % EX OINT: 1.0000 "application " | TOPICAL_OINTMENT | CUTANEOUS | Status: DC | PRN

## 2020-02-24 MED ORDER — FENTANYL CITRATE (PF) 100 MCG/2ML IJ SOLN
INTRAMUSCULAR | Status: DC | PRN
  Administered 2020-02-24: 15 ug via INTRATHECAL

## 2020-02-24 MED ORDER — LACTATED RINGERS IV SOLN: INTRAVENOUS | Status: DC

## 2020-02-24 MED ORDER — WITCH HAZEL-GLYCERIN EX PADS: 1.0000 "application " | MEDICATED_PAD | CUTANEOUS | Status: DC | PRN

## 2020-02-24 MED ORDER — METOCLOPRAMIDE HCL 5 MG/ML IJ SOLN
INTRAMUSCULAR | Status: AC
  Filled 2020-02-24: qty 2

## 2020-02-24 MED ORDER — MISOPROSTOL 200 MCG PO TABS
600.0000 ug | ORAL_TABLET | Freq: Once | ORAL | Status: AC
  Administered 2020-02-24: 600 ug via ORAL

## 2020-02-24 MED ORDER — ONDANSETRON HCL 4 MG/2ML IJ SOLN: 4.0000 mg | Freq: Four times a day (QID) | INTRAMUSCULAR | Status: DC | PRN

## 2020-02-24 MED ORDER — OXYTOCIN 40 UNITS IN NORMAL SALINE INFUSION - SIMPLE MED
INTRAVENOUS | Status: AC
  Filled 2020-02-24: qty 1000

## 2020-02-24 MED ORDER — COCONUT OIL OIL: 1.0000 "application " | TOPICAL_OIL | Status: DC | PRN

## 2020-02-24 MED ORDER — KETOROLAC TROMETHAMINE 30 MG/ML IJ SOLN
30.0000 mg | Freq: Once | INTRAMUSCULAR | Status: AC
  Administered 2020-02-24: 30 mg via INTRAVENOUS

## 2020-02-24 MED ORDER — SODIUM CHLORIDE 0.9 % IV SOLN
510.0000 mg | Freq: Once | INTRAVENOUS | Status: AC
  Administered 2020-02-24: 510 mg via INTRAVENOUS
  Filled 2020-02-24: qty 17

## 2020-02-24 MED ORDER — SCOPOLAMINE 1 MG/3DAYS TD PT72
MEDICATED_PATCH | TRANSDERMAL | Status: AC
  Filled 2020-02-24: qty 1

## 2020-02-24 MED ORDER — CEFAZOLIN SODIUM-DEXTROSE 2-4 GM/100ML-% IV SOLN: 2.0000 g | INTRAVENOUS | Status: DC

## 2020-02-24 MED ORDER — MENTHOL 3 MG MT LOZG: 1.0000 | LOZENGE | OROMUCOSAL | Status: DC | PRN

## 2020-02-24 MED ORDER — SIMETHICONE 80 MG PO CHEW
80.0000 mg | CHEWABLE_TABLET | ORAL | Status: DC
  Administered 2020-02-24 – 2020-02-25 (×2): 80 mg via ORAL
  Filled 2020-02-24 (×2): qty 1

## 2020-02-24 MED ORDER — SCOPOLAMINE 1 MG/3DAYS TD PT72
1.0000 | MEDICATED_PATCH | TRANSDERMAL | Status: DC
  Administered 2020-02-24: 1.5 mg via TRANSDERMAL

## 2020-02-24 MED ORDER — SODIUM CHLORIDE 0.9 % IV SOLN: INTRAVENOUS | Status: AC | PRN

## 2020-02-24 MED ORDER — DIPHENHYDRAMINE HCL 25 MG PO CAPS
25.0000 mg | ORAL_CAPSULE | Freq: Four times a day (QID) | ORAL | Status: DC | PRN
  Administered 2020-02-25: 25 mg via ORAL
  Filled 2020-02-24: qty 1

## 2020-02-24 MED ORDER — SIMETHICONE 80 MG PO CHEW
80.0000 mg | CHEWABLE_TABLET | Freq: Three times a day (TID) | ORAL | Status: DC
  Administered 2020-02-25 – 2020-02-26 (×3): 80 mg via ORAL
  Filled 2020-02-24 (×4): qty 1

## 2020-02-24 MED ORDER — ENOXAPARIN SODIUM 60 MG/0.6ML ~~LOC~~ SOLN
50.0000 mg | SUBCUTANEOUS | Status: DC
  Administered 2020-02-25 – 2020-02-26 (×2): 50 mg via SUBCUTANEOUS
  Filled 2020-02-24 (×2): qty 0.6

## 2020-02-24 MED ORDER — SENNOSIDES-DOCUSATE SODIUM 8.6-50 MG PO TABS
2.0000 | ORAL_TABLET | ORAL | Status: DC
  Administered 2020-02-24 – 2020-02-25 (×2): 2 via ORAL
  Filled 2020-02-24 (×2): qty 2

## 2020-02-24 MED ORDER — EPHEDRINE SULFATE 50 MG/ML IJ SOLN
INTRAMUSCULAR | Status: DC | PRN
  Administered 2020-02-24: 5 mg via INTRAVENOUS

## 2020-02-24 MED ORDER — PHENYLEPHRINE HCL (PRESSORS) 10 MG/ML IV SOLN
INTRAVENOUS | Status: DC | PRN
  Administered 2020-02-24 (×2): 120 ug via INTRAVENOUS
  Administered 2020-02-24: 80 ug via INTRAVENOUS

## 2020-02-24 MED ORDER — MISOPROSTOL 200 MCG PO TABS
ORAL_TABLET | ORAL | Status: AC
  Filled 2020-02-24: qty 3

## 2020-02-24 SURGICAL SUPPLY — 38 items
BENZOIN TINCTURE PRP APPL 2/3 (GAUZE/BANDAGES/DRESSINGS) ×3 IMPLANT
CHLORAPREP W/TINT 26ML (MISCELLANEOUS) ×3 IMPLANT
CLAMP CORD UMBIL (MISCELLANEOUS) IMPLANT
CLOSURE WOUND 1/2 X4 (GAUZE/BANDAGES/DRESSINGS) ×1
CLOTH BEACON ORANGE TIMEOUT ST (SAFETY) ×3 IMPLANT
DRSG OPSITE POSTOP 4X10 (GAUZE/BANDAGES/DRESSINGS) ×3 IMPLANT
ELECT REM PT RETURN 9FT ADLT (ELECTROSURGICAL) ×3
ELECTRODE REM PT RTRN 9FT ADLT (ELECTROSURGICAL) ×1 IMPLANT
EXTRACTOR VACUUM M CUP 4 TUBE (SUCTIONS) IMPLANT
EXTRACTOR VACUUM M CUP 4' TUBE (SUCTIONS)
GAUZE SPONGE 4X4 12PLY STRL LF (GAUZE/BANDAGES/DRESSINGS) ×4 IMPLANT
GLOVE BIOGEL PI IND STRL 7.0 (GLOVE) ×2 IMPLANT
GLOVE BIOGEL PI IND STRL 7.5 (GLOVE) ×2 IMPLANT
GLOVE BIOGEL PI INDICATOR 7.0 (GLOVE) ×4
GLOVE BIOGEL PI INDICATOR 7.5 (GLOVE) ×4
GLOVE ECLIPSE 7.5 STRL STRAW (GLOVE) ×3 IMPLANT
GOWN STRL REUS W/TWL LRG LVL3 (GOWN DISPOSABLE) ×9 IMPLANT
KIT ABG SYR 3ML LUER SLIP (SYRINGE) IMPLANT
NDL HYPO 25X5/8 SAFETYGLIDE (NEEDLE) IMPLANT
NEEDLE HYPO 25X5/8 SAFETYGLIDE (NEEDLE) IMPLANT
NS IRRIG 1000ML POUR BTL (IV SOLUTION) ×3 IMPLANT
PACK C SECTION WH (CUSTOM PROCEDURE TRAY) ×3 IMPLANT
PAD ABD 7.5X8 STRL (GAUZE/BANDAGES/DRESSINGS) ×2 IMPLANT
PAD OB MATERNITY 4.3X12.25 (PERSONAL CARE ITEMS) ×3 IMPLANT
PENCIL SMOKE EVAC W/HOLSTER (ELECTROSURGICAL) ×3 IMPLANT
RTRCTR C-SECT PINK 25CM LRG (MISCELLANEOUS) ×3 IMPLANT
STRIP CLOSURE SKIN 1/2X4 (GAUZE/BANDAGES/DRESSINGS) ×2 IMPLANT
SUT PLAIN 0 NONE (SUTURE) ×3 IMPLANT
SUT PLAIN 2 0 XLH (SUTURE) ×2 IMPLANT
SUT VIC AB 0 CT1 36 (SUTURE) ×3 IMPLANT
SUT VIC AB 2-0 CT1 (SUTURE) ×6 IMPLANT
SUT VIC AB 2-0 CT1 27 (SUTURE) ×2
SUT VIC AB 2-0 CT1 TAPERPNT 27 (SUTURE) ×1 IMPLANT
SUT VIC AB 4-0 KS 27 (SUTURE) ×3 IMPLANT
TOWEL OR 17X24 6PK STRL BLUE (TOWEL DISPOSABLE) ×3 IMPLANT
TRAY FOLEY W/BAG SLVR 14FR LF (SET/KITS/TRAYS/PACK) ×3 IMPLANT
VACUUM CUP M-STYLE MYSTIC II (SUCTIONS) ×2 IMPLANT
WATER STERILE IRR 1000ML POUR (IV SOLUTION) ×3 IMPLANT

## 2020-02-24 NOTE — Anesthesia Postprocedure Evaluation (Signed)
Anesthesia Post Note  Patient: Kristin Lucas  Procedure(s) Performed: CESAREAN SECTION WITH BILATERAL TUBAL LIGATION (N/A )     Patient location during evaluation: PACU Anesthesia Type: Spinal Level of consciousness: oriented and awake and alert Pain management: pain level controlled Vital Signs Assessment: post-procedure vital signs reviewed and stable Respiratory status: spontaneous breathing, respiratory function stable and nonlabored ventilation Cardiovascular status: blood pressure returned to baseline and stable Postop Assessment: no headache, no backache, no apparent nausea or vomiting, spinal receding and patient able to bend at knees Anesthetic complications: no    Last Vitals:  Vitals:   02/24/20 1430 02/24/20 1445  BP: 122/80 124/78  Pulse: 75 72  Resp: 11 16  Temp: 36.7 C   SpO2: 98% 100%    Last Pain:  Vitals:   02/24/20 1445  TempSrc:   PainSc: 0-No pain   Pain Goal:    LLE Motor Response: Non-purposeful movement (02/24/20 1445) LLE Sensation: Tingling (02/24/20 1445) RLE Motor Response: Purposeful movement (02/24/20 1445) RLE Sensation: Tingling (02/24/20 1445)     Epidural/Spinal Function Cutaneous sensation: Tingles (02/24/20 1430), Patient able to flex knees: Yes (02/24/20 1430), Patient able to lift hips off bed: Yes (02/24/20 1430), Back pain beyond tenderness at insertion site: No (02/24/20 1430), Progressively worsening motor and/or sensory loss: No (02/24/20 1430), Bowel and/or bladder incontinence post epidural: No (02/24/20 1430)  Floyd Wade A.

## 2020-02-24 NOTE — Transfer of Care (Signed)
Immediate Anesthesia Transfer of Care Note  Patient: Kristin Lucas  Procedure(s) Performed: CESAREAN SECTION WITH BILATERAL TUBAL LIGATION (N/A )  Patient Location: PACU  Anesthesia Type:Spinal  Level of Consciousness: awake, alert , oriented and patient cooperative  Airway & Oxygen Therapy: Patient Spontanous Breathing  Post-op Assessment: Report given to RN and Post -op Vital signs reviewed and stable  Post vital signs: Reviewed and stable  Last Vitals:  Vitals Value Taken Time  BP 117/72 02/24/20 1340  Temp    Pulse 77 02/24/20 1344  Resp 14 02/24/20 1344  SpO2 99 % 02/24/20 1344  Vitals shown include unvalidated device data.  Last Pain:  Vitals:   02/24/20 1045  TempSrc: Oral         Complications: No apparent anesthesia complications

## 2020-02-24 NOTE — Op Note (Addendum)
Saliha Berling PROCEDURE DATE: 02/24/2020  PREOPERATIVE DIAGNOSES: Intrauterine pregnancy at [redacted]w[redacted]d weeks gestation; elective repeat; undesired fertility  POSTOPERATIVE DIAGNOSES: The same; Vacuum-assisted extraction   PROCEDURE: Repeat Low Transverse Cesarean Section, Bilateral Tubal Sterilization using Pomeroy method  SURGEON:  Dr. Laurey Arrow - Primary Dr. Barrington Ellison - Fellow  ANESTHESIOLOGIST: Dr. Royce Macadamia  INDICATIONS: Kristin Lucas is a 39 y.o. K1S0109 at [redacted]w[redacted]d here for cesarean section and bilateral tubal sterilization secondary to the indications listed under preoperative diagnoses; please see preoperative note for further details.  The risks of surgery were discussed with the patient including but were not limited to: bleeding which may require transfusion or reoperation; infection which may require antibiotics; injury to bowel, bladder, ureters or other surrounding organs; injury to the fetus; need for additional procedures including hysterectomy in the event of a life-threatening hemorrhage; formation of adhesions; placental abnormalities wth subsequent pregnancies; incisional problems; thromboembolic phenomenon and other postoperative/anesthesia complications.  Patient also desires permanent sterilization.  Other reversible forms of contraception were discussed with patient; she declines all other modalities.   Risks of sterilization procedure discussed with patient including but not limited to: risk of regret, permanence of method, bleeding, infection, injury to surrounding organs and need for additional procedures.  Failure risk of about 1% with increased risk of ectopic gestation if pregnancy occurs was also discussed with patient.  Also discussed possibility of post-tubal pain syndrome. The patient concurred with the proposed plan, giving informed written consent for the procedures.  FINDINGS:  Viable female infant in cephalic presentation.  Apgars 8 and 9.  Clear amniotic fluid.  Intact  placenta, three vessel cord.  Normal uterus, fallopian tubes and ovaries bilaterally. Fallopian tubes were sterilized bilaterally. Overall paucity of adhesions.   ANESTHESIA: Spinal INTRAVENOUS FLUIDS: 2300 ml ESTIMATED BLOOD LOSS: 875 ml URINE OUTPUT:  200 ml SPECIMENS: Placenta sent to L&D and bilateral fallopian tube fragments sent to pathology COMPLICATIONS: None immediate  PROCEDURE IN DETAIL:  The patient preoperatively received intravenous antibiotics and had sequential compression devices applied to her lower extremities.   She was then taken to the operating room where spinal anesthesia was administered and was found to be adequate. She was then placed in a dorsal supine position with a leftward tilt, and prepped and draped in a sterile manner.  A foley catheter was placed into her bladder and attached to constant gravity.  After an adequate timeout was performed, a Pfannenstiel skin incision was made with scalpel over her preexisting scar and carried through to the underlying layer of fascia. The fascia was incised in the midline, and this incision was extended bilaterally using the Mayo scissors.  Kocher clamps were applied to the superior aspect of the fascial incision and the underlying rectus muscles were dissected off bluntly.  A similar process was carried out on the inferior aspect of the fascial incision. The rectus muscles were separated in the midline and the peritoneum was entered bluntly. The Alexis self-retaining retractor was introduced into the abdominal cavity.  Attention was turned to the lower uterine segment where a low transverse hysterotomy was made with a scalpel and extended bilaterally bluntly.  Due to angle of fetal vertex, MityVac cup applied per manufacturer instruction and the infant was successfully delivered, the cord was clamped and cut after one minute and the infant was handed over to the awaiting neonatology team. Uterine massage was then administered, and the  placenta delivered intact with a three-vessel cord. The uterus was then cleared of clots and debris.  The hysterotomy was closed with 0 Vicryl in a running locked fashion, and an imbricating layer was also placed with 0 Vicryl. Attention was then turned to the fallopian tubes, the Babcock clamp was then used to grasp the left fallopian tube approximately 3 cm from the cornual region. A 3 cm segment of the tube was then doubly ligated with free tie of plain gut suture, transected and excised. Good hemostasis was noted. The right fallopian tube was then identified, doubly ligated, and a 3 cm segment excised in a similar fashion allowing for bilateral tubal sterilization.Excellent hemostasis was noted. The pelvis was cleared of all clot and debris. Hemostasis was confirmed on all surfaces.  The retractor was removed.  The peritoneum was closed with a 2-0 Vicryl running stitch. The fascia was then closed using 0 Vicryl in a running fashion.  The subcutaneous layer was irrigated and reapproximated with 2-0 plain gut running stitches. The skin was closed with a 4-0 Vicryl subcuticular stitch. The patient tolerated the procedure well. Sponge, instrument and needle counts were correct x 3.  She was taken to the recovery room in stable condition.   Jerilynn Birkenhead, MD [facility] Family Medicine Fellow, [facility] for [employer], [facility]

## 2020-02-24 NOTE — Anesthesia Procedure Notes (Signed)
Spinal  Patient location during procedure: OR Start time: 02/24/2020 12:21 PM End time: 02/24/2020 12:24 PM Staffing Performed: anesthesiologist  Anesthesiologist: Mal Amabile, MD Preanesthetic Checklist Completed: patient identified, IV checked, site marked, risks and benefits discussed, surgical consent, monitors and equipment checked, pre-op evaluation and timeout performed Spinal Block Patient position: sitting Prep: DuraPrep and site prepped and draped Patient monitoring: heart rate, cardiac monitor, continuous pulse ox and blood pressure Approach: midline Location: L3-4 Injection technique: single-shot Needle Needle type: Pencan  Needle gauge: 24 G Needle length: 9 cm Needle insertion depth: 7 cm Assessment Sensory level: T4 Additional Notes Patient tolerated procedure well. Adequate sensory level.

## 2020-02-24 NOTE — H&P (Signed)
Obstetric Preoperative History and Physical  Kristin Lucas is a 39 y.o. G2P1001 with IUP at 23w0dpresenting for scheduled cesarean section.  Reports good fetal movement, no bleeding, no contractions, no leaking of fluid.  No acute preoperative concerns.    Cesarean Section Indication: one prior c-section; elective repeat with BTL  Prenatal Course Source of Care: [facility]  Pregnancy complications or risks: Patient Active Problem List   Diagnosis Date Noted  . Anemia affecting pregnancy, antepartum 02/23/2020  . Unwanted fertility 01/27/2020  . Gestational diabetes mellitus (GDM) affecting pregnancy, antepartum 09/30/2019  . Elevated hemoglobin A1c 09/01/2019  . Chronic hypertension affecting pregnancy 08/31/2019  . History of cesarean delivery affecting pregnancy 08/31/2019  . Family history of breast cancer 08/31/2019  . Supervision of high risk pregnancy, antepartum 08/18/2019   She plans to breastfeed, plans to bottle feed She desires bilateral tubal ligation for postpartum contraception.   Prenatal labs and studies: ABO, Rh: --/--/O POS, O POS [facility] at [facility] [address] [place] [address] ([phone] Antibody: NEG (05/17 0947) Rubella: 6.99 (11/23 1333) RPR: NON REACTIVE (05/17 0947)  HBsAg: Negative (11/23 1333)  HIV: NON REACTIVE (05/17 0947)  GYJE:HUDJSHFW/- (05/06 0236) 2 hr Glucola  abnormal Genetic screening normal Anatomy UKoreanormal  Prenatal Transfer Tool  Maternal Diabetes: Yes:  Diabetes Type:  Insulin/Medication controlled Genetic Screening: Normal Maternal Ultrasounds/Referrals: Normal Fetal Ultrasounds or other Referrals:  None Maternal Substance Abuse:  No Significant Maternal Medications:  None Significant Maternal Lab Results: Group B Strep negative  Past Medical History:  Diagnosis Date  . Gestational diabetes   . Headache   . Hypertension     Past Surgical History:  Procedure Laterality Date  . CESAREAN SECTION   2007  . LIPOSUCTION      OB History  Gravida Para Term Preterm AB Living  _0 SAB TAB Ectopic Multiple Live Births               # Outcome Date GA Lbr Len/2nd Weight Sex Delivery Anes PTL Lv  2 Current           1 Term 07/05/06 461w0d M CS-Unspec       Social History   Socioeconomic History  . Marital status: Single    Spouse name: Not on file  . Number of children: Not on file  . Years of education: Not on file  . Highest education level: Not on file  Occupational History  . Not on file  Tobacco Use  . Smoking status: Never Smoker  . Smokeless tobacco: Never Used  Substance and Sexual Activity  . Alcohol use: Not Currently    Comment: occ  . Drug use: No  . Sexual activity: Yes    Partners: Male    Birth control/protection: None  Other Topics Concern  . Not on file  Social History Narrative  . Not on file   Social Determinants of Health   Financial Resource Strain:   . Difficulty of Paying Living Expenses:   Food Insecurity:   . Worried About RuCharity fundraisern the Last Year:   . RaArboriculturistn the Last Year:   Transportation Needs:   . LaFilm/video editorMedical):   . Marland Kitchenack of Transportation (Non-Medical):   Physical Activity:   . Days of Exercise per Week:   . Minutes of Exercise per Session:   Stress:   .  Feeling of Stress :   Social Connections:   . Frequency of Communication with Friends and Family:   . Frequency of Social Gatherings with Friends and Family:   . Attends Religious Services:   . Active Member of Clubs or Organizations:   . Attends Archivist Meetings:   Marland Kitchen Marital Status:     Family History  Problem Relation Age of Onset  . BRCA 1/2 Mother 27  . Breast cancer Mother   . Diabetes Father   . Hypertension Father   . BRCA 1/2 Paternal Grandmother   . Breast cancer Paternal Grandmother     Facility-Administered Medications Prior to Admission  Medication Dose Route Frequency Provider Last Rate  Last Admin  . insulin starter kit- syringes (English) 1 kit  1 kit Other Once Woodroe Mode, MD       Medications Prior to Admission  Medication Sig Dispense Refill Last Dose  . albuterol (PROVENTIL HFA;VENTOLIN HFA) 108 (90 BASE) MCG/ACT inhaler Inhale 2 puffs into the lungs every 6 (six) hours as needed for wheezing or shortness of breath. 1 Inhaler 0   . aspirin EC 81 MG tablet Take 1 tablet (81 mg total) by mouth daily. 30 tablet 10   . docusate sodium (COLACE) 100 MG capsule Take 1 capsule (100 mg total) by mouth 2 (two) times daily as needed. 30 capsule 2   . insulin aspart (NOVOLOG) 100 UNIT/ML injection For insulin pump, 100 units a day. 30 mL 12   . labetalol (NORMODYNE) 200 MG tablet Take 1 tablet (200 mg total) by mouth 2 (two) times daily. 60 tablet 0   . Prenat-Fe Poly-Methfol-FA-DHA (VITAFOL ULTRA) 29-0.6-0.4-200 MG CAPS Take 1 tablet by mouth daily. 30 capsule 12   . Accu-Chek Softclix Lancets lancets Use as instructed 100 each 12   . Blood Pressure KIT Use as directed 1 kit 0   . Blood Pressure Monitoring (BLOOD PRESSURE MONITOR/L CUFF) MISC Use as directed 1 each 0   . Elastic Bandages & Supports (COMFORT FIT MATERNITY SUPP MED) MISC 1 Device by Does not apply route daily. 1 each 0   . glucose blood test strip Test Blood Glucose 4 x a day. Fasting and 2 hours after breakfast, lunch and dinner. 100 each 12     No Known Allergies  Review of Systems: Pertinent items noted in HPI and remainder of comprehensive ROS otherwise negative.  Physical Exam: BP (!) 158/99 (BP Location: Right Arm)   Pulse 80   Temp 98.4 F (36.9 C) (Oral)   Resp 18   Ht 5' 7" (1.702 m)   Wt 108 kg   LMP 06/03/2019 (Exact Date)   BMI 37.28 kg/m  FHR by Doppler: 151 bpm CONSTITUTIONAL: Well-developed, well-nourished female in no acute distress.  HENT:  Normocephalic, atraumatic, External right and left ear normal. Oropharynx is clear and moist EYES: Conjunctivae and EOM are normal. No scleral  icterus.  NECK: Normal range of motion, supple, no masses SKIN: Skin is warm and dry. No rash noted. Not diaphoretic. No erythema. No pallor. [place]: Alert and oriented to person, place, and time. Normal reflexes, muscle tone coordination. No cranial nerve deficit noted. PSYCHIATRIC: Normal mood and affect. Normal behavior. Normal judgment and thought content. CARDIOVASCULAR: Normal heart rate noted RESPIRATORY: Effort and breath sounds normal, no problems with respiration noted ABDOMEN: Soft, nontender, nondistended, gravid. Well-healed Pfannenstiel incision. PELVIC: Deferred MUSCULOSKELETAL: Normal range of motion. No edema and no tenderness. 2+ distal pulses.   Pertinent Labs/Studies:  Results for orders placed or performed during the hospital encounter of 02/24/20 (from the past 72 hour(s))  Glucose, capillary     Status: Abnormal   Collection Time: 02/24/20 11:03 AM  Result Value Ref Range   Glucose-Capillary 121 (H) 70 - 99 mg/dL    Comment: Glucose reference range applies only to samples taken after fasting for at least 8 hours.    Assessment and Plan: Kristin Lucas is a 39 y.o. G2P1001 at 72w0dbeing admitted for scheduled cesarean section with BTL.  The risks of cesarean section were discussed with the patient including but were not limited to: bleeding which may require transfusion or reoperation; infection which may require antibiotics; injury to bowel, bladder, ureters or other surrounding organs; injury to the fetus; need for additional procedures including hysterectomy in the event of a life-threatening hemorrhage; formation of adhesions; placental abnormalities wth subsequent pregnancies; incisional problems; thromboembolic phenomenon and other postoperative/anesthesia complications.  Patient also desires permanent sterilization.  Other reversible forms of contraception were discussed with patient; she declines all other modalities. This will be done either via bilateral  salpingectomy or bilateral application of Filshie clips.  Risks of procedure discussed with patient including but not limited to: risk of regret, permanence of method, bleeding, infection, injury to surrounding organs and need for additional procedures.  Failure risk of about 1% with increased risk of ectopic gestation if pregnancy occurs was also discussed with patient.  Also discussed possibility of post-tubal pain syndrome. The patient concurred with the proposed plan, giving informed written consent for the procedures.  Patient has been NPO since midnight she will remain NPO for procedure. Anesthesia and OR aware.  Preoperative prophylactic antibiotics and SCDs ordered on call to the OR.  To OR when ready.  Pregnancy Complications:  -GDMA2: has been on insulin pump and sugars well-controlled, A1c 5.9 in Nov 2020; will plan to check fasting blood sugar in AM and f/u with GTT post-partum -cHTN: has been on Labetalol 200 mg BID; no medications pre-pregnancy; will plan to start Enalapril post-partum due to hx GDM -Anemia: Hgb 9.8 on admission; consider IV iron post-partum Contraception: BTL; discussed possible salpingectomy and patient agreeable; will assess intra-operatively Circumcision: NA; girl MOF: Both   CBarrington Ellison MD OB Family Medicine Fellow, [facility]for [facility]

## 2020-02-24 NOTE — Discharge Summary (Signed)
Postpartum Discharge Summary    Patient Name: Kristin Lucas DOB: 1980-12-26 MRN: [id number]  Date of admission: 02/24/2020 Delivery date:02/24/2020  Delivering provider: Laurey Arrow [facility]  Date of discharge: 02/26/2020  Admitting diagnosis: Status post repeat low transverse cesarean section [Z98.891] Intrauterine pregnancy: [redacted]w[redacted]d    Secondary diagnosis:  Active Problems:   Supervision of high risk pregnancy, antepartum   Chronic hypertension affecting pregnancy   History of cesarean delivery affecting pregnancy   Elevated hemoglobin A1c   Gestational diabetes mellitus (GDM) affecting pregnancy, antepartum   Unwanted fertility   Anemia affecting pregnancy, antepartum   Status post repeat low transverse cesarean section  Additional problems: None    Discharge diagnosis: Term Pregnancy Delivered, CHTN and GDM A2                                              Post partum procedures:none Augmentation: N/A Complications: None  Hospital course: Sceduled C/S   39y.o. yo G2P2002 at 386w0das admitted to the hospital 02/24/2020 for scheduled cesarean section with the following indication:GDMA2 and cHTN.Delivery details are as follows:  Membrane Rupture Time/Date: 12:50 PM ,02/24/2020   Delivery Method:C-Section, Vacuum Assisted  Details of operation can be found in separate operative note. BLT done by [employer] Steel Holding CorporationPatient had an uncomplicated postpartum course. BP's monitored post-partum initially normal but on day of discharge became mild range, patient was asymptomatic. Previously on labetalol but preferred once daily medication, started on enalapril at time of discharge. She is ambulating, tolerating a regular diet, passing flatus, and urinating well. Patient is discharged home in stable condition on  02/26/20        Newborn Data: Birth date:02/24/2020  Birth time:12:51 PM  Gender:Female  Living status:Living  Apgars:8 ,9  Weight:3520 g     Magnesium Sulfate received: No BMZ received:  No Rhophylac:No MMR:No T-DaP:declined Flu: No Transfusion:No  Physical exam  Vitals:   02/25/20 0552 02/25/20 1429 02/26/20 0557 02/26/20 0902  BP: 115/65 118/75 132/90 (!) 141/85  Pulse: 77 86 85 80  Resp: '18 18 18   ' Temp: 97.9 F (36.6 C) 98.8 F (37.1 C) 97.6 F (36.4 C)   TempSrc: Axillary Oral Oral   SpO2: 100% 100%    Weight:      Height:       General: alert, cooperative and no distress Lochia: appropriate Uterine Fundus: firm Incision: Dressing is clean, dry, and intact DVT Evaluation: No evidence of DVT seen on physical exam. No significant calf/ankle edema. Labs: Lab Results  Component Value Date   WBC 9.7 02/25/2020   HGB 8.1 (L) 02/25/2020   HCT 25.0 (L) 02/25/2020   MCV 84.5 02/25/2020   PLT 233 02/25/2020   CMP Latest Ref Rng & Units 02/25/2020  Glucose 70 - 99 mg/dL 114(H)  BUN 6 - 20 mg/dL -  Creatinine 0.44 - 1.00 mg/dL -  Sodium 134 - 144 mmol/L -  Potassium 3.5 - 5.2 mmol/L -  Chloride 96 - 106 mmol/L -  CO2 20 - 29 mmol/L -  Calcium 8.7 - 10.2 mg/dL -  Total Protein 6.0 - 8.5 g/dL -  Total Bilirubin 0.0 - 1.2 mg/dL -  Alkaline Phos 39 - 117 IU/L -  AST 0 - 40 IU/L -  ALT 0 - 32 IU/L -   Edinburgh Score: Edinburgh Postnatal Depression Scale Screening Tool 02/25/2020  I have  been able to laugh and see the funny side of things. 0  I have looked forward with enjoyment to things. 0  I have blamed myself unnecessarily when things went wrong. 0  I have been anxious or worried for no good reason. 0  I have felt scared or panicky for no good reason. 0  Things have been getting on top of me. 0  I have been so unhappy that I have had difficulty sleeping. 0  I have felt sad or miserable. 1  I have been so unhappy that I have been crying. 0  The thought of harming myself has occurred to me. 0  Edinburgh Postnatal Depression Scale Total 1     After visit meds:  Allergies as of 02/26/2020   No Known Allergies     Medication List    STOP  taking these medications   Accu-Chek Softclix Lancets lancets   aspirin EC 81 MG tablet   Blood Pressure Kit   Blood Pressure Monitor/L Cuff Misc   Comfort Fit Maternity Supp Med Misc   docusate sodium 100 MG capsule Commonly known as: COLACE   glucose blood test strip   insulin aspart 100 UNIT/ML injection Commonly known as: novoLOG   labetalol 200 MG tablet Commonly known as: NORMODYNE     TAKE these medications   albuterol 108 (90 Base) MCG/ACT inhaler Commonly known as: VENTOLIN HFA Inhale 2 puffs into the lungs every 6 (six) hours as needed for wheezing or shortness of breath.   enalapril 5 MG tablet Commonly known as: VASOTEC Take 1 tablet (5 mg total) by mouth daily.   ferrous sulfate 325 (65 FE) MG tablet Take 1 tablet (325 mg total) by mouth every other day. Start taking on: Feb 27, 2020   ibuprofen 600 MG tablet Commonly known as: ADVIL Take 1 tablet (600 mg total) by mouth every 6 (six) hours.   oxyCODONE 5 MG immediate release tablet Commonly known as: Oxy IR/ROXICODONE Take 1-2 tablets (5-10 mg total) by mouth every 4 (four) hours as needed for moderate pain.   Vitafol Ultra 29-0.6-0.4-200 MG Caps Take 1 tablet by mouth daily.        Discharge home in stable condition Infant Feeding: Breast Infant Disposition:home with mother Discharge instruction: per After Visit Summary and Postpartum booklet. Activity: Advance as tolerated. Pelvic rest for 6 weeks.  Diet: routine diet Future Appointments: Future Appointments  Date Time Provider [place]  03/04/2020 10:00 AM [place] None  04/07/2020  8:45 AM [facility] None  04/07/2020  9:00 AM Constant, Peggy, MD [place] None   Follow up Visit: [place] Follow up on 03/04/2020.   Specialty: Obstetrics and Gynecology Why: for incision check Contact information: [address], [place] [phone]           Please schedule this patient for a In person postpartum visit in 4 weeks with the following provider: Any provider. Additional Postpartum F/U:2 hour GTT in 4-6 weeks, Incision check 1 week and BP check 1 week, CMP check (started on enalapril) High risk pregnancy complicated by: GDM and HTN Delivery mode:  C-Section, Vacuum Assisted  Anticipated Birth Control:  BTL done [facility]   02/26/2020 Clarnce Flock, MD

## 2020-02-25 ENCOUNTER — Encounter: Payer: Self-pay | Admitting: *Deleted

## 2020-02-25 LAB — CBC
HCT: 25 % — ABNORMAL LOW (ref 36.0–46.0)
Hemoglobin: 8.1 g/dL — ABNORMAL LOW (ref 12.0–15.0)
MCH: 27.4 pg (ref 26.0–34.0)
MCHC: 32.4 g/dL (ref 30.0–36.0)
MCV: 84.5 fL (ref 80.0–100.0)
Platelets: 233 10*3/uL (ref 150–400)
RBC: 2.96 MIL/uL — ABNORMAL LOW (ref 3.87–5.11)
RDW: 14 % (ref 11.5–15.5)
WBC: 9.7 10*3/uL (ref 4.0–10.5)
nRBC: 0.2 % (ref 0.0–0.2)

## 2020-02-25 LAB — GLUCOSE, RANDOM: Glucose, Bld: 114 mg/dL — ABNORMAL HIGH (ref 70–99)

## 2020-02-25 LAB — SURGICAL PATHOLOGY

## 2020-02-25 LAB — GLUCOSE, CAPILLARY: Glucose-Capillary: 111 mg/dL — ABNORMAL HIGH (ref 70–99)

## 2020-02-25 MED ORDER — FERROUS SULFATE 325 (65 FE) MG PO TABS: 325.0000 mg | ORAL_TABLET | ORAL | 2 refills | Status: DC

## 2020-02-25 MED ORDER — IBUPROFEN 600 MG PO TABS: 600.0000 mg | ORAL_TABLET | Freq: Four times a day (QID) | ORAL | 0 refills | Status: DC

## 2020-02-25 MED ORDER — FERROUS SULFATE 325 (65 FE) MG PO TABS
325.0000 mg | ORAL_TABLET | ORAL | Status: DC
  Administered 2020-02-25: 325 mg via ORAL
  Filled 2020-02-25: qty 1

## 2020-02-25 MED ORDER — OXYCODONE HCL 5 MG PO TABS: 5.0000 mg | ORAL_TABLET | ORAL | 0 refills | Status: DC | PRN

## 2020-02-25 NOTE — Progress Notes (Addendum)
Post Partum Day 1  Subjective:  Kristin Lucas is a 39 y.o. [id number] [redacted]w[redacted]d POD#1 s/p repeat c-section and bilateral tubal ligation.  No acute events overnight.  Pt has ambulated without difficulty. Patient denies trouble with po intake. Foley catheter is still in place- urine clear yellow, has not voided yet. She had nausea yesterday but denies nausea or vomiting today.  Pain is well controlled.  She has had flatus. She has not had bowel movement.  Lochia is appropriate. Patient received bilateral tubal ligation for birth control.  Method of Feeding: breast and bottle feeding.  Objective: BP 115/65 (BP Location: Right Arm)   Pulse 77   Temp 97.9 F (36.6 C) (Axillary)   Resp 18   Ht 5\' 7"  (1.702 m)   Wt 108 kg   LMP 06/03/2019 (Exact Date)   SpO2 100%   Breastfeeding Unknown   BMI 37.28 kg/m   Physical Exam:  General: alert, cooperative and no distress Chest: CTAB, no increased work of breathing Heart: RRR no m/r/g Abdomen: soft, nontender; dressing dry, clean, and intact Uterine Fundus: firm DVT Evaluation: No evidence of DVT seen on physical exam. Extremities: no pitting edema  Recent Labs    02/24/20 1755 02/25/20 0624  HGB 9.4* 8.1*  HCT 30.0* 25.0*    Assessment/Plan:  ASSESSMENT: Kristin Lucas is a 39 y.o. G2P2002 [redacted]w[redacted]d POD #1 s/p C-section w/ bilateral tubal ligation doing well.   Anemia: Trending downward Hgb and HCT - s/p IV iron - starting PO Iron  A@GDM  on insulin - Fasting CBG 111 - 2 hr. GTT @postpartum  visit  Breastfeeding - Lactation support as needed  Anticipate DC home tomorrow.   LOS: 1 day   [redacted]w[redacted]d, MS3 02/25/2020, 6:56 AM   GME ATTESTATION:  I saw and evaluated the patient. I agree with the findings and the plan of care as documented in the student's note.  Joyce Gross, DO OB Fellow, Faculty [facility], Center for [facility] 02/25/2020 7:31 AM

## 2020-02-25 NOTE — Lactation Note (Signed)
This note was copied from a baby's chart. Lactation Consultation Note  Patient Name: Kristin Lucas UAUEB'V Date: 02/25/2020  Mom is a G2P2.Baby Kristin Alanni now 63 hours old.  Mom reports she did not try to breastfeed with her first baby.  Mom reports plans to do both breastfeeding and formula feeding with her. Mom in chair and infant in crib.  Mom falling asleep while talking to her.  Mom does not have a breast pump for home use. Discussed pumping with DEBP every time mom gives bottle to have good supply for later and anything she gets as a bonus to go ahead and feed in place of formula.  Mom agreed.   When took pump and all pumping accessories mom reports she would like to wait until later to pump.  Urged her to always offer the breast first.Reviewed and left [facility] Consultation Breastfeeding Resource Booklet.  Urged mom to call lactation as needed.   Maternal Data    Feeding    LATCH Score                   Interventions    Lactation Tools Discussed/Used     Consult Status      Kristin Lucas 02/25/2020, 6:36 PM

## 2020-02-26 MED ORDER — OXYCODONE HCL 5 MG PO TABS: 5.0000 mg | ORAL_TABLET | ORAL | 0 refills | Status: AC | PRN

## 2020-02-26 MED ORDER — ENALAPRIL MALEATE 5 MG PO TABS: 5.0000 mg | ORAL_TABLET | Freq: Every day | ORAL | 2 refills | Status: AC

## 2020-02-26 MED ORDER — IBUPROFEN 600 MG PO TABS: 600.0000 mg | ORAL_TABLET | Freq: Four times a day (QID) | ORAL | 0 refills | Status: AC

## 2020-02-26 MED ORDER — FERROUS SULFATE 325 (65 FE) MG PO TABS: 325.0000 mg | ORAL_TABLET | ORAL | 2 refills | Status: AC

## 2020-02-26 MED FILL — FERROUS SULFATE 325 MG TAB: 325 (65 FE) | 30 days supply | Qty: 15 | Fill #0

## 2020-02-26 MED FILL — oxyCODONE HCL 5 MG TABS: 5 | 4 days supply | Qty: 20 | Fill #0

## 2020-02-26 MED FILL — IBUPROFEN 600 MG TABLET: 600 | 7 days supply | Qty: 30 | Fill #0

## 2020-02-26 MED FILL — ENALAPRIL MALEATE 5 MG TABS: 5 | 30 days supply | Qty: 30 | Fill #0

## 2020-02-26 NOTE — Discharge Instructions (Signed)
Cesarean Delivery, Care After This sheet gives you information about how to care for yourself after your procedure. Your health care provider may also give you more specific instructions. If you have problems or questions, contact your health care provider. What can I expect after the procedure? After the procedure, it is common to have:  A small amount of blood or clear fluid coming from the incision.  Some redness, swelling, and pain in your incision area.  Some abdominal pain and soreness.  Vaginal bleeding (lochia). Even though you did not have a vaginal delivery, you will still have vaginal bleeding and discharge.  Pelvic cramps.  Fatigue. You may have pain, swelling, and discomfort in the tissue between your vagina and your anus (perineum) if:  Your C-section was unplanned, and you were allowed to labor and push.  An incision was made in the area (episiotomy) or the tissue tore during attempted vaginal delivery. Follow these instructions at home: Incision care   Follow instructions from your health care provider about how to take care of your incision. Make sure you: ? Wash your hands with soap and water before you change your bandage (dressing). If soap and water are not available, use hand sanitizer. ? If you have a dressing, change it or remove it as told by your health care provider. ? Leave stitches (sutures), skin staples, skin glue, or adhesive strips in place. These skin closures may need to stay in place for 2 weeks or longer. If adhesive strip edges start to loosen and curl up, you may trim the loose edges. Do not remove adhesive strips completely unless your health care provider tells you to do that.  Check your incision area every day for signs of infection. Check for: ? More redness, swelling, or pain. ? More fluid or blood. ? Warmth. ? Pus or a bad smell.  Do not take baths, swim, or use a hot tub until your health care provider says it's okay. Ask your health  care provider if you can take showers.  When you cough or sneeze, hug a pillow. This helps with pain and decreases the chance of your incision opening up (dehiscing). Do this until your incision heals. Medicines  Take over-the-counter and prescription medicines only as told by your health care provider.  If you were prescribed an antibiotic medicine, take it as told by your health care provider. Do not stop taking the antibiotic even if you start to feel better.  Do not drive or use heavy machinery while taking prescription pain medicine. Lifestyle  Do not drink alcohol. This is especially important if you are breastfeeding or taking pain medicine.  Do not use any products that contain nicotine or tobacco, such as cigarettes, e-cigarettes, and chewing tobacco. If you need help quitting, ask your health care provider. Eating and drinking  Drink at least 8 eight-ounce glasses of water every day unless told not to by your health care provider. If you breastfeed, you may need to drink even more water.  Eat high-fiber foods every day. These foods may help prevent or relieve constipation. High-fiber foods include: ? Whole grain cereals and breads. ? Brown rice. ? Beans. ? Fresh fruits and vegetables. Activity   If possible, have someone help you care for your baby and help with household activities for at least a few days after you leave the hospital.  Return to your normal activities as told by your health care provider. Ask your health care provider what activities are safe for   you.  Rest as much as possible. Try to rest or take a nap while your baby is sleeping.  Do not lift anything that is heavier than 10 lbs (4.5 kg), or the limit that you were told, until your health care provider says that it is safe.  Talk with your health care provider about when you can engage in sexual activity. This may depend on your: ? Risk of infection. ? How fast you heal. ? Comfort and desire to  engage in sexual activity. General instructions  Do not use tampons or douches until your health care provider approves.  Wear loose, comfortable clothing and a supportive and well-fitting bra.  Keep your perineum clean and dry. Wipe from front to back when you use the toilet.  If you pass a blood clot, save it and call your health care provider to discuss. Do not flush blood clots down the toilet before you get instructions from your health care provider.  Keep all follow-up visits for you and your baby as told by your health care provider. This is important. Contact a health care provider if:  You have: ? A fever. ? Bad-smelling vaginal discharge. ? Pus or a bad smell coming from your incision. ? Difficulty or pain when urinating. ? A sudden increase or decrease in the frequency of your bowel movements. ? More redness, swelling, or pain around your incision. ? More fluid or blood coming from your incision. ? A rash. ? Nausea. ? Little or no interest in activities you used to enjoy. ? Questions about caring for yourself or your baby.  Your incision feels warm to the touch.  Your breasts turn red or become painful or hard.  You feel unusually sad or worried.  You vomit.  You pass a blood clot from your vagina.  You urinate more than usual.  You are dizzy or light-headed. Get help right away if:  You have: ? Pain that does not go away or get better with medicine. ? Chest pain. ? Difficulty breathing. ? Blurred vision or spots in your vision. ? Thoughts about hurting yourself or your baby. ? New pain in your abdomen or in one of your legs. ? A severe headache.  You faint.  You bleed from your vagina so much that you fill more than one sanitary pad in one hour. Bleeding should not be heavier than your heaviest period. Summary  After the procedure, it is common to have pain at your incision site, abdominal cramping, and slight bleeding from your vagina.  Check  your incision area every day for signs of infection.  Tell your health care provider about any unusual symptoms.  Keep all follow-up visits for you and your baby as told by your health care provider. This information is not intended to replace advice given to you by your health care provider. Make sure you discuss any questions you have with your health care provider. Document Revised: 04/02/2018 Document Reviewed: 04/02/2018 Elsevier Patient Education  2020 [employer]    Postpartum Hypertension Postpartum hypertension is high blood pressure that remains higher than normal after childbirth. You may not realize that you have postpartum hypertension if your blood pressure is not being checked regularly. In most cases, postpartum hypertension will go away on its own, usually within a week of delivery. However, for some women, medical treatment is required to prevent serious complications, such as seizures or stroke. What are the causes? This condition may be caused by one or more of the   following:  Hypertension that existed before pregnancy (chronic hypertension).  Hypertension that comes on as a result of pregnancy (gestational hypertension).  Hypertensive disorders during pregnancy (preeclampsia) or seizures in women who have high blood pressure during pregnancy (eclampsia).  A condition in which the liver, platelets, and red blood cells are damaged during pregnancy (HELLP syndrome).  A condition in which the thyroid produces too much hormones (hyperthyroidism).  Other rare problems of the nerves (neurological disorders) or blood disorders. In some cases, the cause may not be known. What increases the risk? The following factors may make you more likely to develop this condition:  Chronic hypertension. In some cases, this may not have been diagnosed before pregnancy.  Obesity.  Type 2 diabetes.  Kidney disease.  History of preeclampsia or eclampsia.  Other medical  conditions that change the level of hormones in the body (hormonal imbalance). What are the signs or symptoms? As with all types of hypertension, postpartum hypertension may not have any symptoms. Depending on how high your blood pressure is, you may experience:  Headaches. These may be mild, moderate, or severe. They may also be steady, constant, or sudden in onset (thunderclap headache).  Changes in your ability to see (visual changes).  Dizziness.  Shortness of breath.  Swelling of your hands, feet, lower legs, or face. In some cases, you may have swelling in more than one of these locations.  Heart palpitations or a racing heartbeat.  Difficulty breathing while lying down.  Decrease in the amount of urine that you pass. Other rare signs and symptoms may include:  Sweating more than usual. This lasts longer than a few days after delivery.  Chest pain.  Sudden dizziness when you get up from sitting or lying down.  Seizures.  Nausea or vomiting.  Abdominal pain. How is this diagnosed? This condition may be diagnosed based on the results of a physical exam, blood pressure measurements, and blood and urine tests. You may also have other tests, such as a CT scan or an MRI, to check for other problems of postpartum hypertension. How is this treated? If blood pressure is high enough to require treatment, your options may include:  Medicines to reduce blood pressure (antihypertensives). Tell your health care provider if you are breastfeeding or if you plan to breastfeed. There are many antihypertensive medicines that are safe to take while breastfeeding.  Stopping medicines that may be causing hypertension.  Treating medical conditions that are causing hypertension.  Treating the complications of hypertension, such as seizures, stroke, or kidney problems. Your health care provider will also continue to monitor your blood pressure closely until it is within a safe range for  you. Follow these instructions at home:  Take over-the-counter and prescription medicines only as told by your health care provider.  Return to your normal activities as told by your health care provider. Ask your health care provider what activities are safe for you.  Do not use any products that contain nicotine or tobacco, such as cigarettes and e-cigarettes. If you need help quitting, ask your health care provider.  Keep all follow-up visits as told by your health care provider. This is important. Contact a health care provider if:  Your symptoms get worse.  You have new symptoms, such as: ? A headache that does not get better. ? Dizziness. ? Visual changes. Get help right away if:  You suddenly develop swelling in your hands, ankles, or face.  You have sudden, rapid weight gain.  You develop   difficulty breathing, chest pain, racing heartbeat, or heart palpitations.  You develop severe pain in your abdomen.  You have any symptoms of a stroke. "BE FAST" is an easy way to remember the main warning signs of a stroke: ? B - Balance. Signs are dizziness, sudden trouble walking, or loss of balance. ? E - Eyes. Signs are trouble seeing or a sudden change in vision. ? F - Face. Signs are sudden weakness or numbness of the face, or the face or eyelid drooping on one side. ? A - Arms. Signs are weakness or numbness in an arm. This happens suddenly and usually on one side of the body. ? S - Speech. Signs are sudden trouble speaking, slurred speech, or trouble understanding what people say. ? T - Time. Time to call emergency services. Write down what time symptoms started.  You have other signs of a stroke, such as: ? A sudden, severe headache with no known cause. ? Nausea or vomiting. ? Seizure. These symptoms may represent a serious problem that is an emergency. Do not wait to see if the symptoms will go away. Get medical help right away. Call your local emergency services (911 in  the [place] Do not drive yourself to the hospital. Summary  Postpartum hypertension is high blood pressure that remains higher than normal after childbirth.  In most cases, postpartum hypertension will go away on its own, usually within a week of delivery.  For some women, medical treatment is required to prevent serious complications, such as seizures or stroke. This information is not intended to replace advice given to you by your health care provider. Make sure you discuss any questions you have with your health care provider. Document Revised: 10/31/2018 Document Reviewed: 07/15/2017 Elsevier Patient Education  2020 [employer] Inc.  

## 2020-02-26 NOTE — Progress Notes (Signed)
Notified Dr. Crissie Reese of patient's blood pressure of 141/85. Denies PIH signs and symptoms. Patient has a history of increased blood pressure and had been on Labetalol during pregnancy. Not currently on any hypertensive medication. Dr. Crissie Reese will come to bedside to see patient.

## 2020-02-26 NOTE — Lactation Note (Signed)
This note was copied from a baby's chart. Lactation Consultation Note  Patient Name: Kristin Lucas [id number] Date: 02/26/2020 Reason for consult: Follow-up assessment;1st time breastfeeding;Early term 37-38.6wks;Infant weight loss;Other (Comment)(6 % weight loss)  Baby is 88 hours old / early D/C .  Mom breastfeeding goal is to breast feed and formula .  Per mom active with [facility] ,  Mom has a DEBP kit and LC offered to send a DEBP referral to [facility] this am and  Recommended to mom to call for a DEBP.  LC reviewed sore nipple and engorgement prevention and tx .  Mom has the DEBP kit and has not pumped.  Per mom has latched once over night.  LC reviewed supply and demand/ stressed the importance of latching , and prior to  Latching , moist warm heat, breast massage, hand express , and pre-pump to prime the  Milk ducts. Try to latch the baby early cues prior to the baby getting to hungry. If really hungry since the baby has gotten some bottles , may need to give the baby and appetizer of EBM or formula prior to latch.     Maternal Data    Feeding Feeding Type: (last feeding was a bottle) Nipple Type: Slow - flow  LATCH Score                   Interventions Interventions: Breast feeding basics reviewed;DEBP  Lactation Tools Discussed/Used Tools: Pump Breast pump type: Double-Electric Breast Pump WIC Program: Yes   Consult Status Consult Status: Complete Date: 02/26/20    Myer Haff 02/26/2020, 11:43 AM

## 2020-03-04 ENCOUNTER — Ambulatory Visit: Payer: Medicaid Other

## 2020-03-11 ENCOUNTER — Other Ambulatory Visit: Payer: Self-pay

## 2020-03-11 ENCOUNTER — Ambulatory Visit: Payer: Medicaid Other

## 2020-03-11 VITALS — BP 143/89 | HR 78

## 2020-03-11 DIAGNOSIS — Z0130 Encounter for examination of blood pressure without abnormal findings: Secondary | ICD-10-CM

## 2020-03-11 NOTE — Progress Notes (Signed)
Pt is in the office for pp incision and BP check. Pt reports that she has not taken enalapril yet. BP today is 143/89, denies abnormal symptoms. Pt's incision is clean, intact and dry, denies any complications.  Provider advised that pt return in 1 week for repeat BP check, pt agreed.

## 2020-03-18 ENCOUNTER — Ambulatory Visit: Payer: Medicaid Other

## 2020-04-07 ENCOUNTER — Other Ambulatory Visit: Payer: Medicaid Other

## 2020-04-07 ENCOUNTER — Ambulatory Visit: Payer: Medicaid Other | Admitting: Obstetrics and Gynecology

## 2020-09-19 ENCOUNTER — Encounter: Payer: Self-pay | Admitting: General Practice

## 2021-08-02 IMAGING — US US MFM OB DETAIL+14 WK
1 series · 13 of 28 positions shown · non-contrast
Comparison: none

[Series 1: us mfm ob detail+14 wk · 13 of 66 slices shown]
[im 3/66]
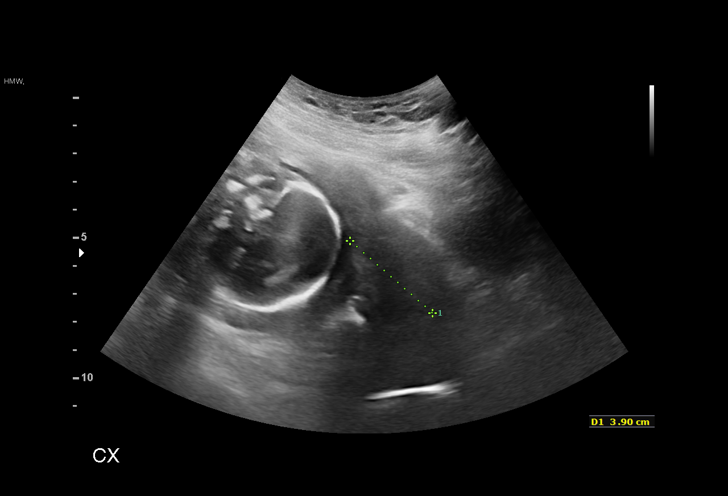
[im 8/66]
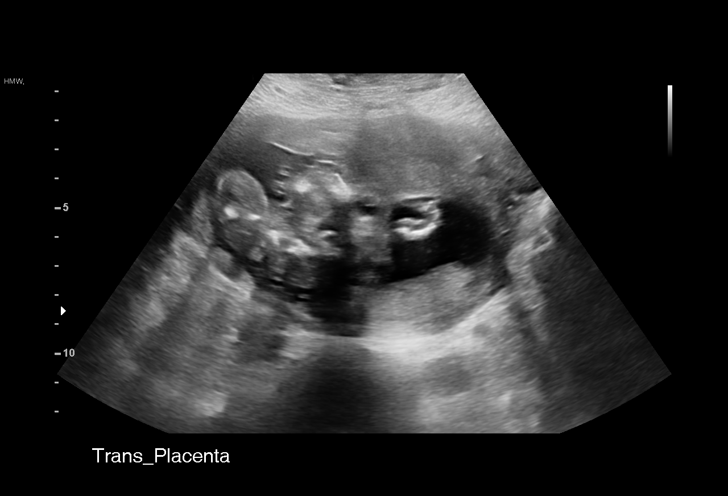
[im 13/66]
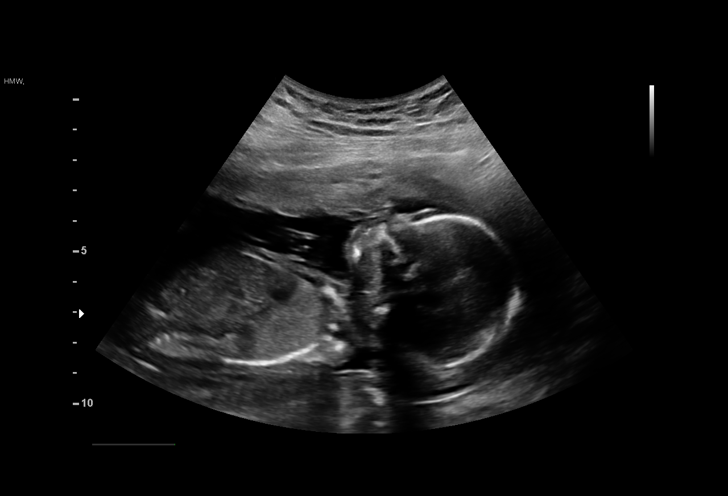
[im 17/66]
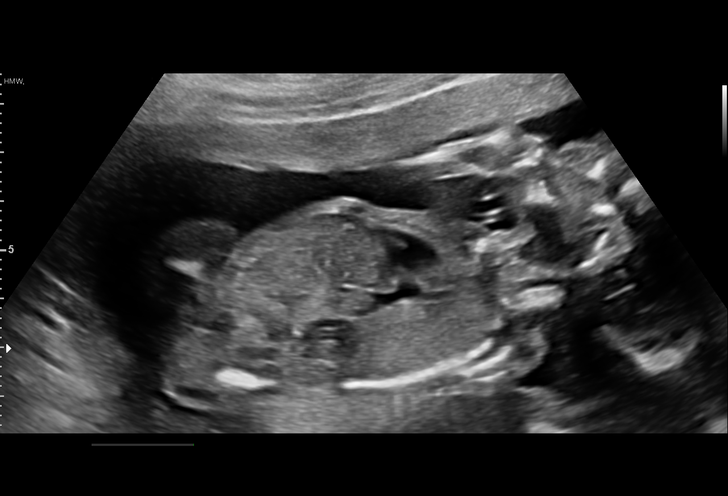
[im 22/66]
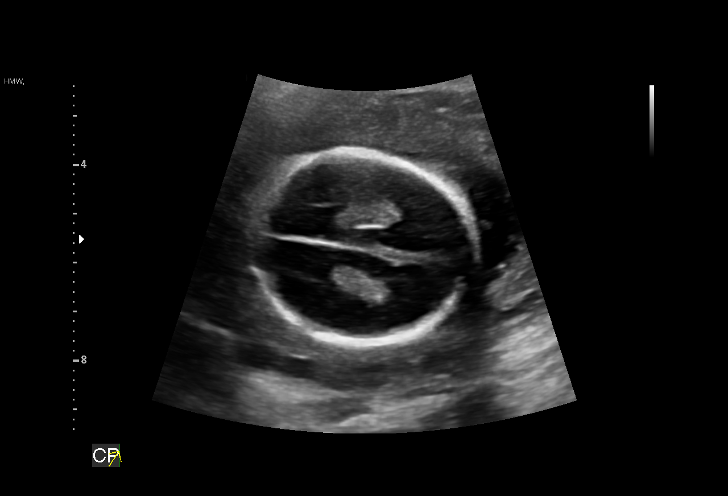
[im 27/66]
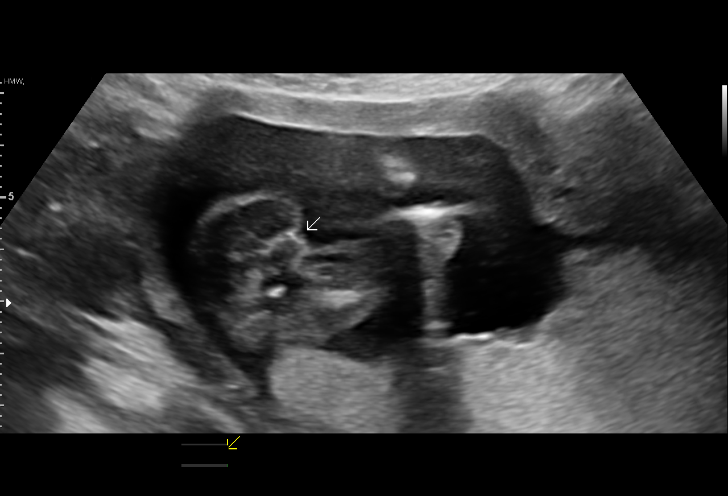
[im 34/66]
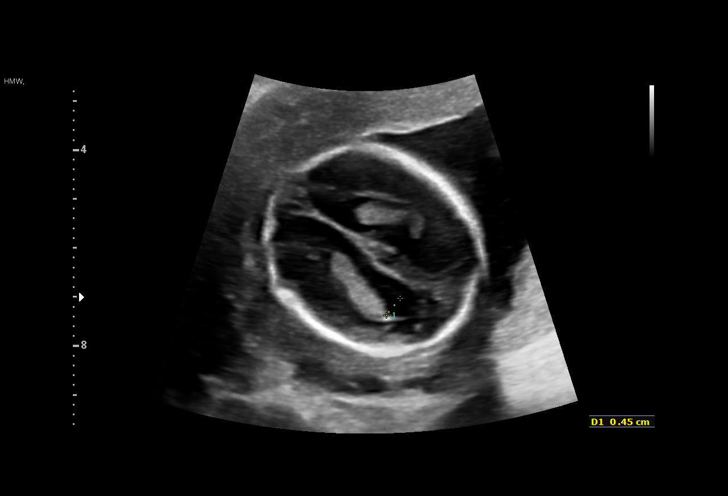
[im 39/66]
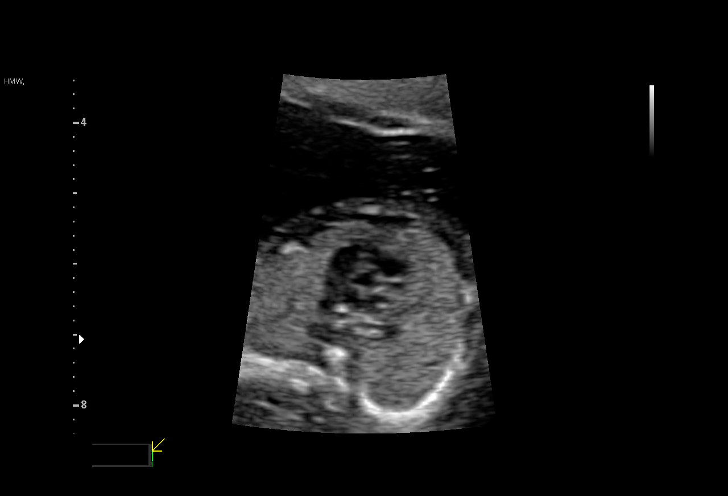
[im 44/66]
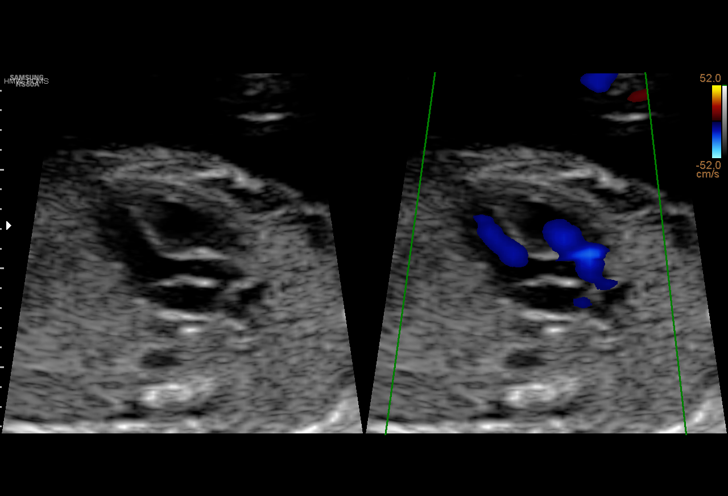
[im 49/66]
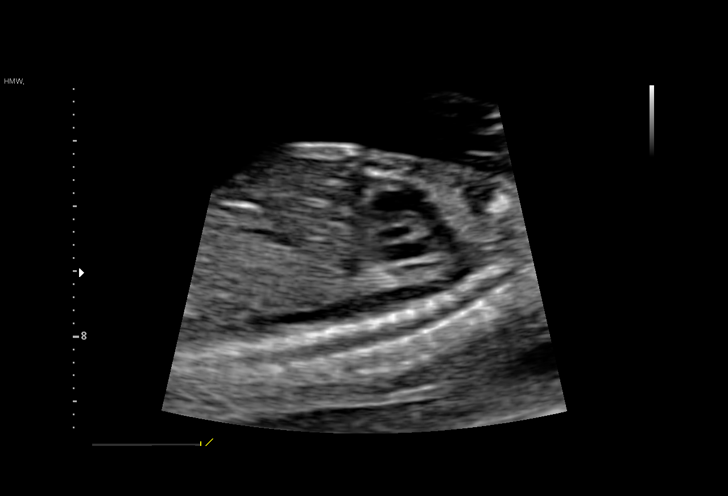
[im 53/66]
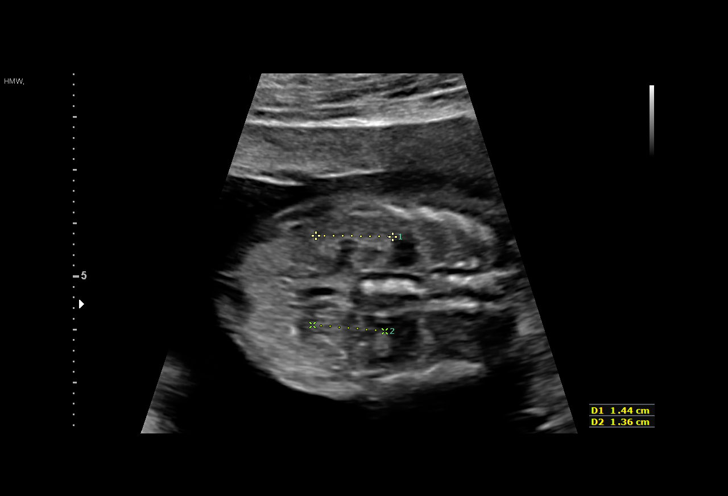
[im 58/66]
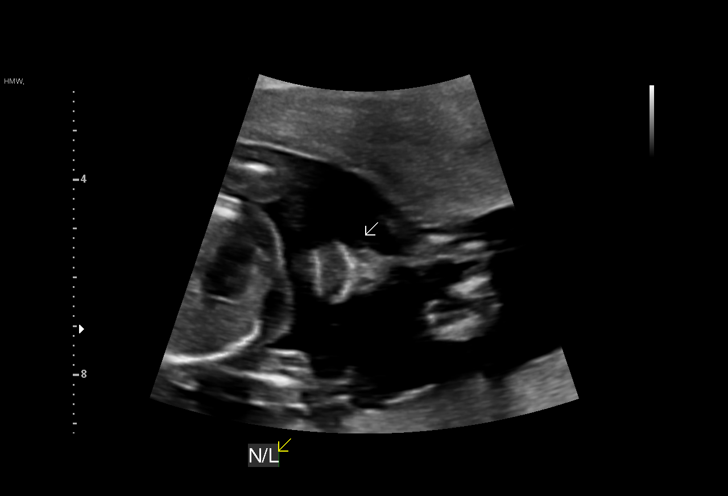
[im 63/66]
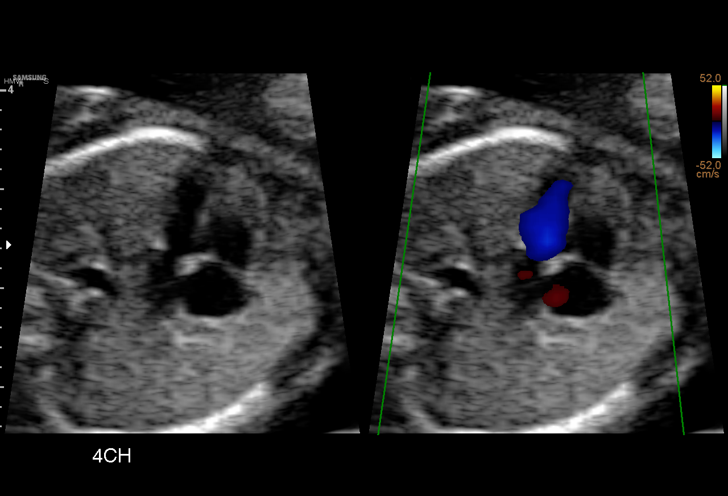

[13 of 28 positions shown; findings below may reference images not displayed]

----------------------------------------------------------------------

 ----------------------------------------------------------------------
Indications

  18 weeks gestation of pregnancy
  Encounter for antenatal screening for
  malformations
  Advanced maternal age multigravida 35+,
  second trimester (low risk NIPS, 6.C33)
  Hypertension - Chronic/Pre-existing
  (labetalol)
  Obesity complicating pregnancy, second
  trimester
  Previous cesarean delivery, antepartum x 1
  Gestational diabetes in pregnancy, diet
  controlled
 ----------------------------------------------------------------------
Fetal Evaluation

 Num Of Fetuses:         1
 Fetal Heart Rate(bpm):  150
 Cardiac Activity:       Observed
 Presentation:           Cephalic
 Placenta:               Posterior
 P. Cord Insertion:      Visualized, central

 Amniotic Fluid
 AFI FV:      Within normal limits

                             Largest Pocket(cm)

Biometry
 BPD:      40.4  mm     G. Age:  18w 2d         25  %    CI:        78.43   %    70 - 86
                                                         FL/HC:      18.5   %    16.1 -
 HC:      144.3  mm     G. Age:  17w 5d          4  %    HC/AC:      1.08        1.09 -
 AC:      133.8  mm     G. Age:  18w 6d         46  %    FL/BPD:     66.1   %
 FL:       26.7  mm     G. Age:  18w 1d         19  %    FL/AC:      20.0   %    20 - 24
 HUM:        26  mm     G. Age:  18w 1d         32  %
 CER:      16.9  mm     G. Age:  16w 6d          6  %
 NFT:         3  mm
 CM:        3.9  mm

 Est. FW:     239  gm      0 lb 8 oz     22  %
OB History

 Gravidity:    2         Term:   1        Prem:   0        SAB:   0
 TOP:          0       Ectopic:  0        Living: 1
Gestational Age

 LMP:           18w 6d        Date:  06/03/19                 EDD:   03/09/20
 U/S Today:     18w 2d                                        EDD:   03/13/20
 Best:          18w 6d     Det. By:  LMP  (06/03/19)          EDD:   03/09/20
Anatomy

 Cranium:               Appears normal         LVOT:                   Appears normal
 Cavum:                 Appears normal         Aortic Arch:            Not well visualized
 Ventricles:            Appears normal         Ductal Arch:            Appears normal
 Choroid Plexus:        Appears normal         Diaphragm:              Appears normal
 Cerebellum:            Appears normal         Stomach:                Appears normal, left
                                                                       sided
 Posterior Fossa:       Appears normal         Abdomen:                Appears normal
 Nuchal Fold:           Appears normal         Abdominal Wall:         Appears nml (cord
                                                                       insert, abd wall)
 Face:                  Appears normal         Cord Vessels:           Appears normal (3
                        (orbits and profile)                           vessel cord)
 Lips:                  Appears normal         Kidneys:                Appear normal
 Palate:                Not well visualized    Bladder:                Appears normal
 Thoracic:              Appears normal         Spine:                  Not well visualized
 Heart:                 Appears normal         Upper Extremities:      Appears normal
                        (4CH, axis, and
                        situs)
 RVOT:                  Appears normal         Lower Extremities:      Appears normal

 Other:  Nasal bone visualized. Heels and 5th digit visualized. 3VV visualized.
Cervix Uterus Adnexa

 Cervix
 Normal appearance by transabdominal scan.

 Uterus
 No abnormality visualized.

 Left Ovary
 No adnexal mass visualized.

 Right Ovary
 No adnexal mass visualized.
 Cul De Sac
 No free fluid seen.

 Adnexa
 No abnormality visualized.
Impression

 Single intrauterine pregnancy with normal anatomy today.
 Suboptimal views of the fetal anatomy was obtained
 secondary to fetal position.

 Ms. Bing has chronic hypertension on medical therapy and
 new diagnosed GDM managed with diet, given a fasting of
 109 mg/dL. She had a JbSBc of 5.9%.

 She has a low risk NIPS result.
Recommendations

 Follow up anatomy in 4 weeks.

## 2021-08-22 ENCOUNTER — Other Ambulatory Visit: Payer: Self-pay

## 2021-08-22 ENCOUNTER — Emergency Department (HOSPITAL_BASED_OUTPATIENT_CLINIC_OR_DEPARTMENT_OTHER)
Admission: EM | Admit: 2021-08-22 | Discharge: 2021-08-22 | Disposition: A | Discharge status: Home / Self Care | Payer: Medicaid Other | Attending: Emergency Medicine | Admitting: Emergency Medicine

## 2021-08-22 ENCOUNTER — Encounter (HOSPITAL_BASED_OUTPATIENT_CLINIC_OR_DEPARTMENT_OTHER): Payer: Self-pay

## 2021-08-22 DIAGNOSIS — R519 Headache, unspecified: Secondary | ICD-10-CM | POA: Diagnosis not present

## 2021-08-22 DIAGNOSIS — Y92410 Unspecified street and highway as the place of occurrence of the external cause: Secondary | ICD-10-CM | POA: Insufficient documentation

## 2021-08-22 DIAGNOSIS — T148XXA Other injury of unspecified body region, initial encounter: Secondary | ICD-10-CM

## 2021-08-22 DIAGNOSIS — I10 Essential (primary) hypertension: Secondary | ICD-10-CM | POA: Insufficient documentation

## 2021-08-22 DIAGNOSIS — S29012A Strain of muscle and tendon of back wall of thorax, initial encounter: Secondary | ICD-10-CM | POA: Insufficient documentation

## 2021-08-22 DIAGNOSIS — S299XXA Unspecified injury of thorax, initial encounter: Secondary | ICD-10-CM | POA: Diagnosis present

## 2021-08-22 MED ORDER — METHOCARBAMOL 500 MG PO TABS: 500.0000 mg | ORAL_TABLET | Freq: Two times a day (BID) | ORAL | 0 refills | Status: AC | PRN
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
## 2021-08-22 NOTE — Discharge Instructions (Signed)
I am prescribing you a strong muscle relaxer called Robaxin.  You can take this up to 2 times a day for management of your pain.  This medication can be sedating so do not mix it with alcohol.  Do not drive a car after taking it.  I recommend a combination of tylenol and ibuprofen for management of your pain. You can take a low dose of both at the same time. I recommend 500 mg of Tylenol combined with 600 mg of ibuprofen. This is one maximum strength Tylenol and three regular ibuprofen. You can take these 2-3 times for day for your pain. Please try to take these medications with a small amount of food as well to prevent upsetting your stomach.  Also, please consider topical pain relieving creams such as Voltaran Gel, BioFreeze, or Icy Hot. There is also a pain relieving cream made by [employer]. You should be able to find all of these at your local pharmacy.   Please continue to monitor your symptoms closely.  If you develop any new or worsening symptoms please come back to the emergency department.
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
## 2021-11-18 IMAGING — US US MFM FETAL BPP W/O NON-STRESS
1 series · 15 of 16 positions shown · non-contrast
Comparison: none

[Series 1: us mfm fetal bpp w/o non-stress · 16 acquisitions, 15 frames shown]
[im 1/16]
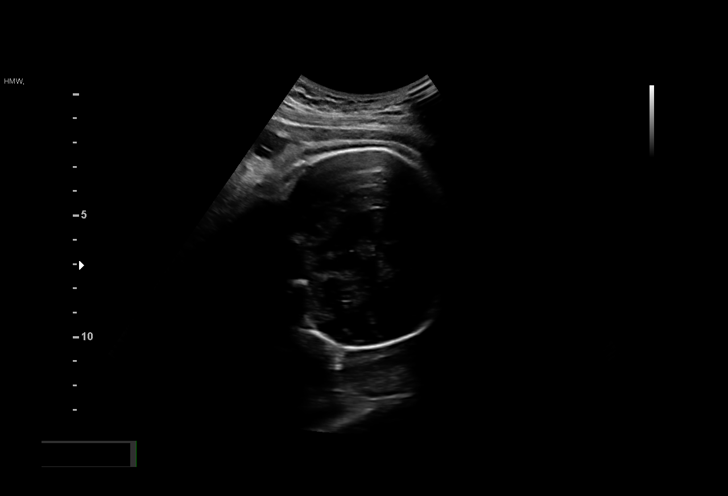
[im 2/16]
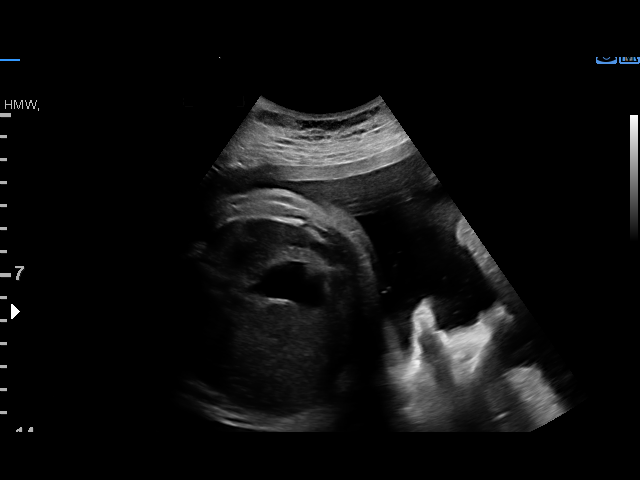
[im 3/16]
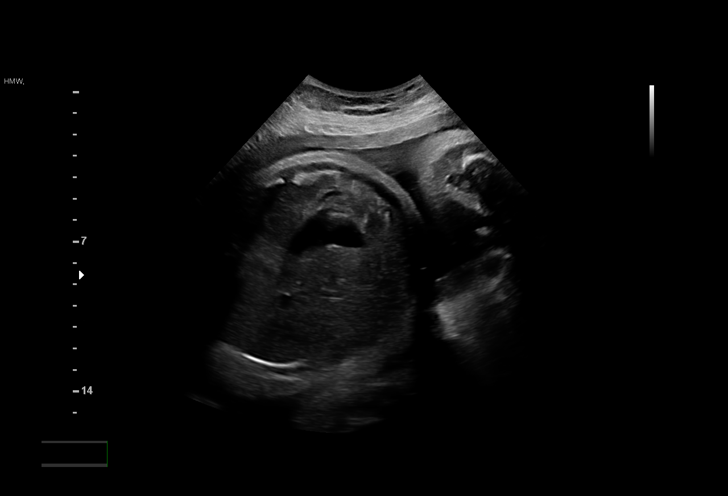
[im 4/16]
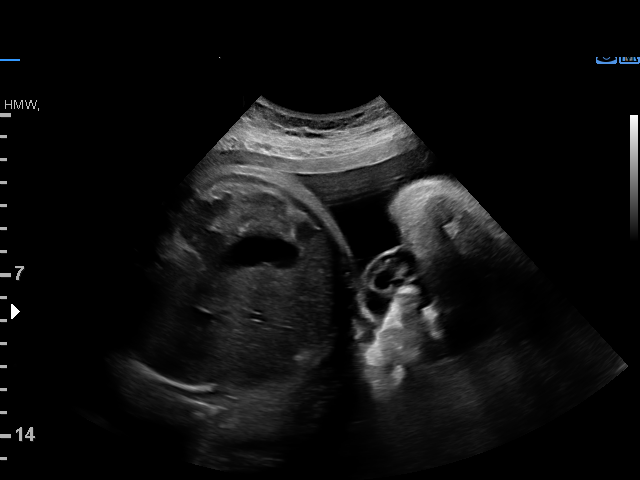
[im 5/16]
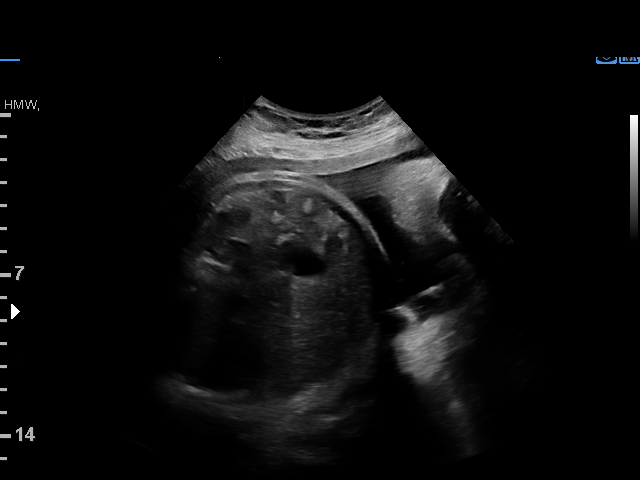
[im 6/16]
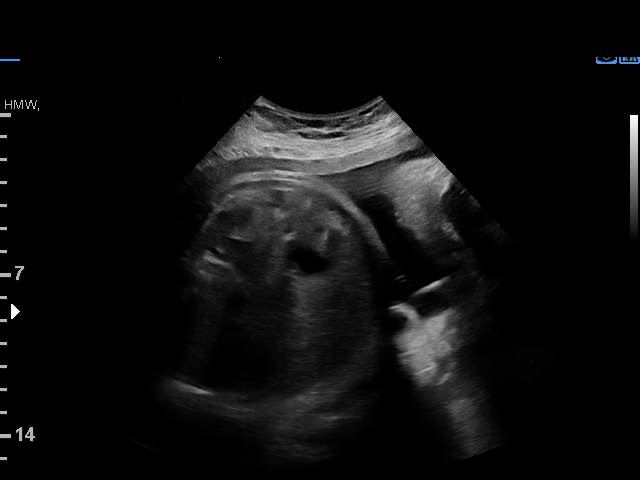
[im 7/16]
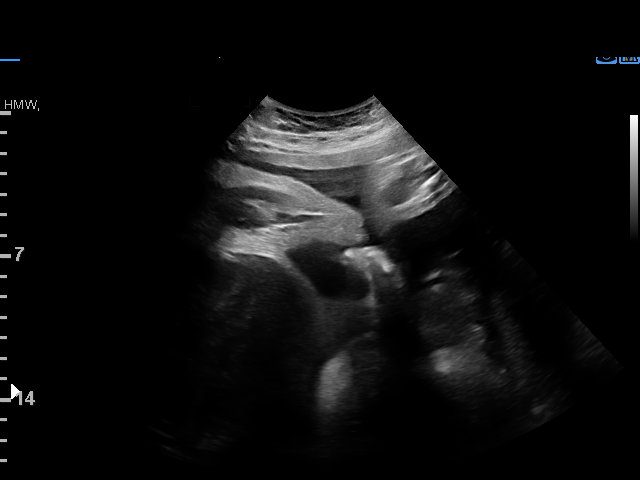
[im 9/16]
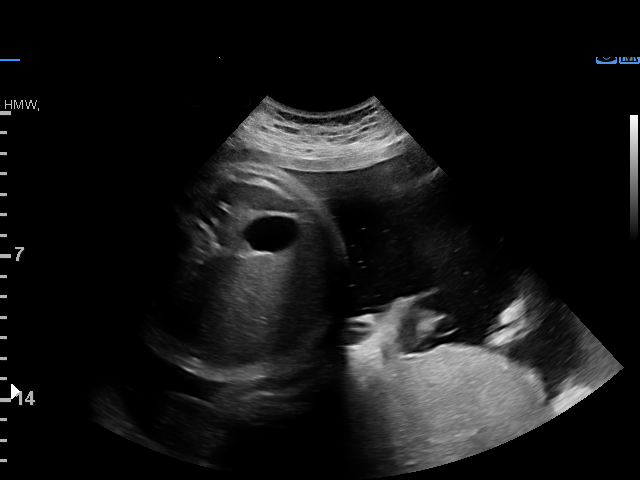
[im 10/16]
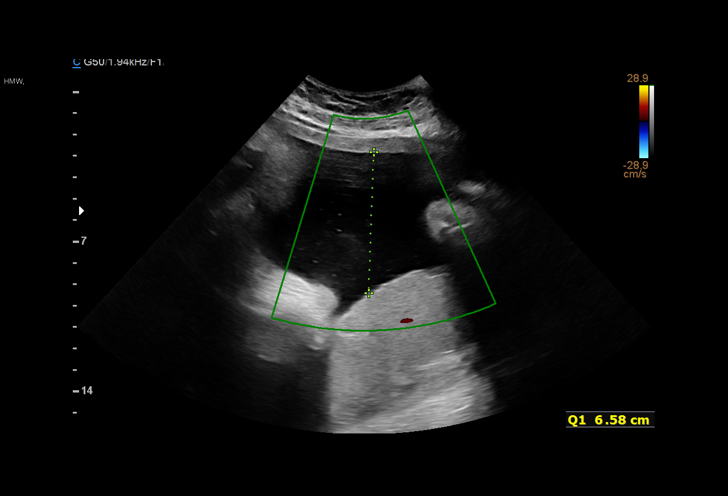
[im 11/16]
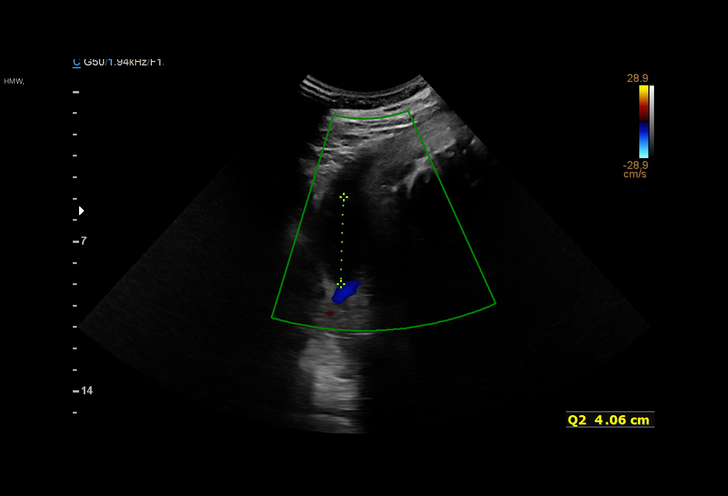
[im 12/16]
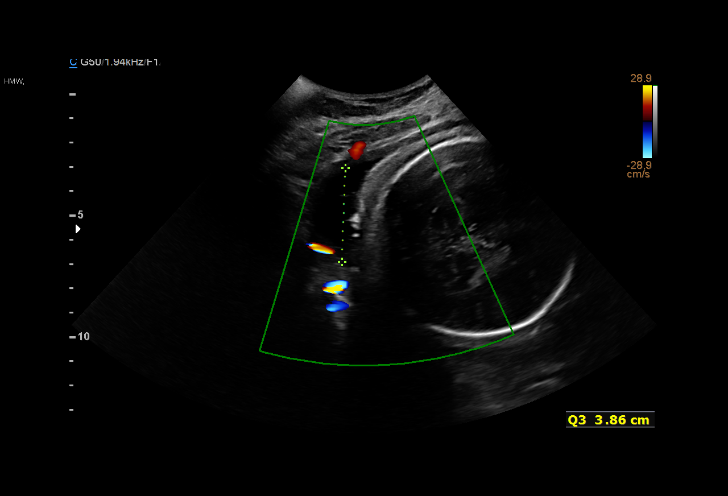
[im 13/16]
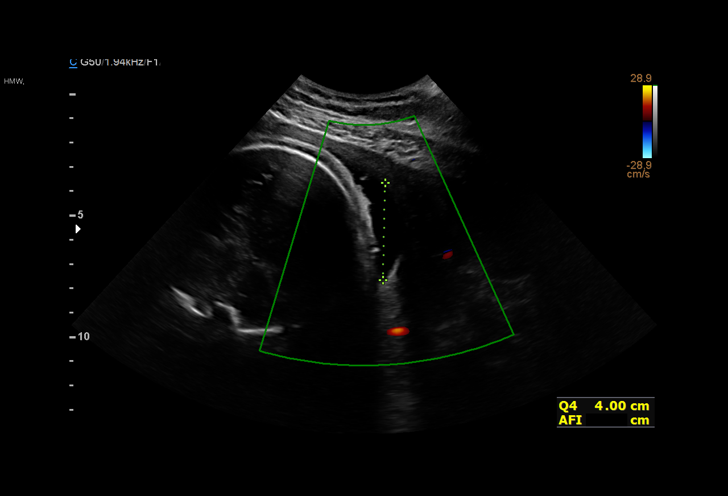
[im 14/16]
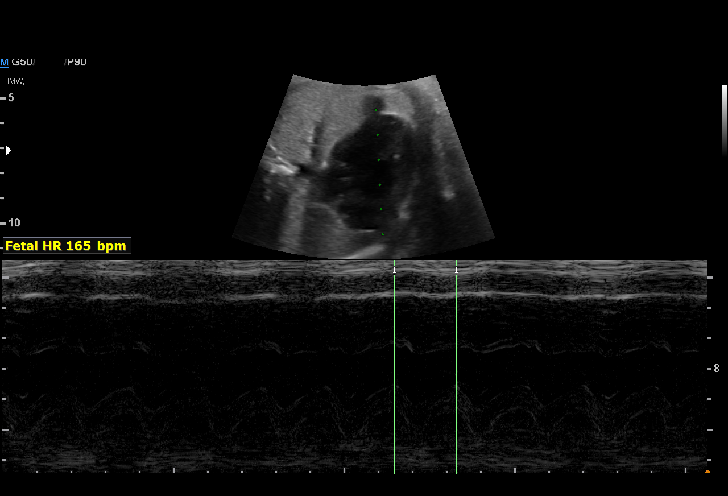
[im 15/16]
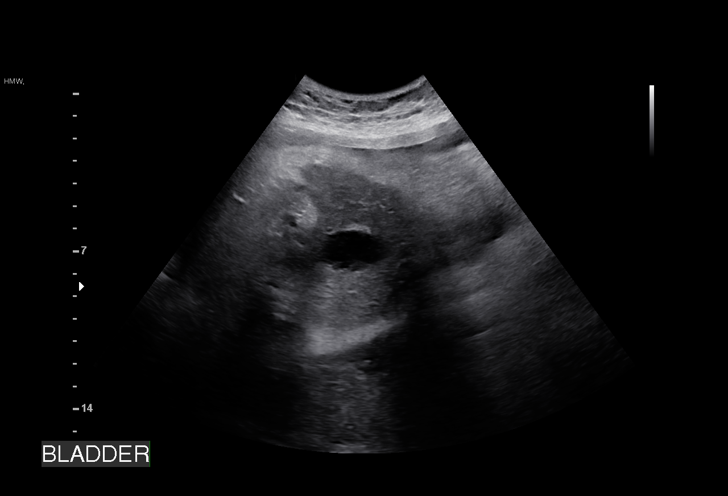
[im 16/16]
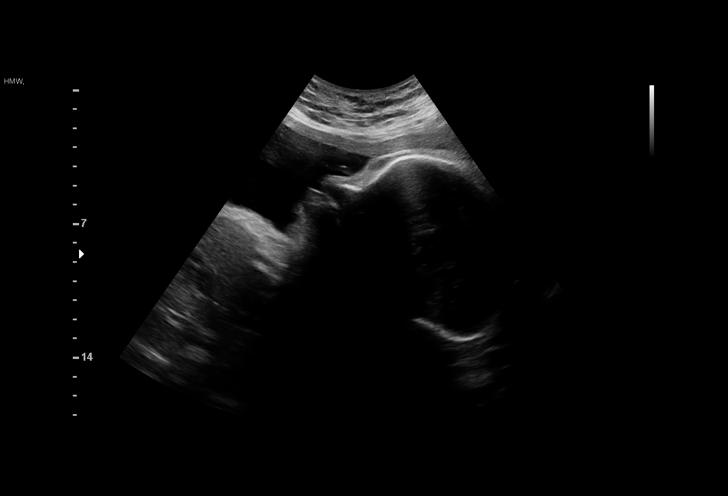

[15 of 16 positions shown; findings below may reference images not displayed]

----------------------------------------------------------------------

 ----------------------------------------------------------------------
Indications

  34 weeks gestation of pregnancy
  Hypertension - Chronic/Pre-existing
  Encounter for other antenatal screening
  follow-up
  Gestational diabetes in pregnancy,
  controlled by oral hypoglycemic drugs
  (metformin)
  Advanced maternal age multigravida 35+,
  third trimester
  Obesity complicating pregnancy, third
  trimester
  Previous cesarean delivery, antepartum x 1
 ----------------------------------------------------------------------
Fetal Evaluation

 Num Of Fetuses:         1
 Fetal Heart Rate(bpm):  165
 Cardiac Activity:       Observed
 Presentation:           Cephalic

 Amniotic Fluid
 AFI FV:      Within normal limits

 AFI Sum(cm)     %Tile       Largest Pocket(cm)
 18.5            68

 RUQ(cm)       RLQ(cm)       LUQ(cm)        LLQ(cm)
 6.58          4
Biophysical Evaluation
 Amniotic F.V:   Within normal limits       F. Tone:        Observed
 F. Movement:    Observed                   Score:          [DATE]
 F. Breathing:   Observed
OB History

 Gravidity:    2         Term:   1        Prem:   0        SAB:   0
 TOP:          0       Ectopic:  0        Living: 1
Gestational Age

 LMP:           34w 2d        Date:  06/03/19                 EDD:   03/09/20
 Best:          34w 2d     Det. By:  LMP  (06/03/19)          EDD:   03/09/20
Anatomy

 Thoracic:              Appears normal         Bladder:                Appears normal
 Stomach:               Appears normal, left
                        sided
Impression

 Chronic hypertension.  Patient takes labetalol for control.  Her
 blood pressures today at our office are 145/85 and 155/83
 mmHg.  She has no symptoms of severe features of
 preeclampsia
 Gestational diabetes.  Patient has infusion pump.  She
 reports her fasting levels are in the 120s.  She has been
 discussing with our diabetic educator.

 Amniotic fluid is normal and good fetal activity is seen.
 Antenatal testing is reassuring. BPP [DATE].

 Discussed the importance of good blood glucose control to
 prevent adverse fetal or neonatal outcomes.  If diabetes is
 not well controlled delivery may be considered at 37 or 38
 weeks to prevent the complication of stillbirth.
Recommendations

 -Continue weekly BPP till delivery.
                 Ozuna, Gero

## 2022-09-04 ENCOUNTER — Other Ambulatory Visit: Payer: Self-pay | Admitting: Internal Medicine

## 2022-09-05 LAB — COMPLETE METABOLIC PANEL WITH GFR
AG Ratio: 1.1 (calc) (ref 1.0–2.5)
ALT: 9 U/L (ref 6–29)
AST: 14 U/L (ref 10–30)
Albumin: 4.1 g/dL (ref 3.6–5.1)
Alkaline phosphatase (APISO): 83 U/L (ref 31–125)
BUN/Creatinine Ratio: 15 (calc) (ref 6–22)
BUN: 15 mg/dL (ref 7–25)
CO2: 19 mmol/L — ABNORMAL LOW (ref 20–32)
Calcium: 9.4 mg/dL (ref 8.6–10.2)
Chloride: 108 mmol/L (ref 98–110)
Creat: 1.01 mg/dL — ABNORMAL HIGH (ref 0.50–0.97)
Globulin: 3.9 g/dL (calc) — ABNORMAL HIGH (ref 1.9–3.7)
Glucose, Bld: 99 mg/dL (ref 65–99)
Potassium: 4.4 mmol/L (ref 3.5–5.3)
Sodium: 142 mmol/L (ref 135–146)
Total Bilirubin: 0.5 mg/dL (ref 0.2–1.2)
Total Protein: 8 g/dL (ref 6.1–8.1)
eGFR: 73 mL/min/{1.73_m2} (ref >=60)

## 2022-09-05 LAB — LIPID PANEL
Cholesterol: 130 mg/dL (ref <200)
HDL: 62 mg/dL (ref >=50)
LDL Cholesterol (Calc): 51 mg/dL (calc)
Non-HDL Cholesterol (Calc): 68 mg/dL (calc) (ref <130)
Total CHOL/HDL Ratio: 2.1 (calc) (ref <5.0)
Triglycerides: 89 mg/dL (ref <150)

## 2022-09-05 LAB — CBC
HCT: 39.3 % (ref 35.0–45.0)
Hemoglobin: 13 g/dL (ref 11.7–15.5)
MCH: 27.1 pg (ref 27.0–33.0)
MCHC: 33.1 g/dL (ref 32.0–36.0)
MCV: 81.9 fL (ref 80.0–100.0)
MPV: 10.3 fL (ref 7.5–12.5)
Platelets: 349 10*3/uL (ref 140–400)
RBC: 4.8 10*6/uL (ref 3.80–5.10)
RDW: 14.3 % (ref 11.0–15.0)
WBC: 9.5 10*3/uL (ref 3.8–10.8)

## 2022-09-05 LAB — TSH: TSH: 1.85 mIU/L

## 2022-09-05 LAB — VITAMIN D 25 HYDROXY (VIT D DEFICIENCY, FRACTURES): Vit D, 25-Hydroxy: 12 ng/mL — ABNORMAL LOW (ref 30–100)

## 2024-05-06 ENCOUNTER — Telehealth: Payer: Self-pay

## 2024-05-06 NOTE — Telephone Encounter (Signed)
 Called the pt left message on her machine to call the office back to get scheduled from a referral that was rec'd from [facility] Screening for Cervical Cancer ([facility]).
